# Patient Record
Sex: Female | Born: 1975 | Race: Black or African American | Hispanic: No | Marital: Married | Smoking: Never smoker
Health system: Southern US, Community
[De-identification: ages and names within clinical notes are randomized; demographics above are authoritative.]

## PROBLEM LIST (undated history)

## (undated) HISTORY — PX: WISDOM TOOTH EXTRACTION: SHX21

---

## 2003-03-09 ENCOUNTER — Other Ambulatory Visit: Admission: RE | Admit: 2003-03-09 | Discharge: 2003-03-09 | Payer: Self-pay | Admitting: Family Medicine

## 2004-03-21 ENCOUNTER — Other Ambulatory Visit: Admission: RE | Admit: 2004-03-21 | Discharge: 2004-03-21 | Payer: Self-pay | Admitting: Obstetrics and Gynecology

## 2004-10-20 ENCOUNTER — Inpatient Hospital Stay (HOSPITAL_COMMUNITY): Admission: AD | Admit: 2004-10-20 | Discharge: 2004-10-23 | Payer: Self-pay | Admitting: Obstetrics and Gynecology

## 2004-12-23 ENCOUNTER — Other Ambulatory Visit: Admission: RE | Admit: 2004-12-23 | Discharge: 2004-12-23 | Payer: Self-pay | Admitting: Obstetrics and Gynecology

## 2008-10-22 ENCOUNTER — Ambulatory Visit (HOSPITAL_COMMUNITY): Admission: RE | Admit: 2008-10-22 | Discharge: 2008-10-22 | Payer: Self-pay | Admitting: Obstetrics and Gynecology

## 2008-10-22 IMAGING — US US OB DETAIL+14 WK
1 series · 18 of 28 positions shown · non-contrast
Comparison: none

OBSTETRICAL ULTRASOUND:
 This ultrasound was performed in The [HOSPITAL], and the [facility] report will be stored to [REDACTED] PACS.

[Series 1: us ob detail+14 wk · 18 of 82 slices shown]
[im 1/82]
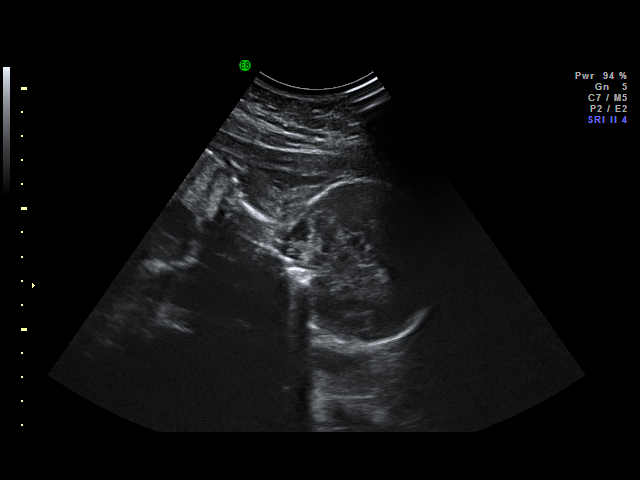
[im 7/82]
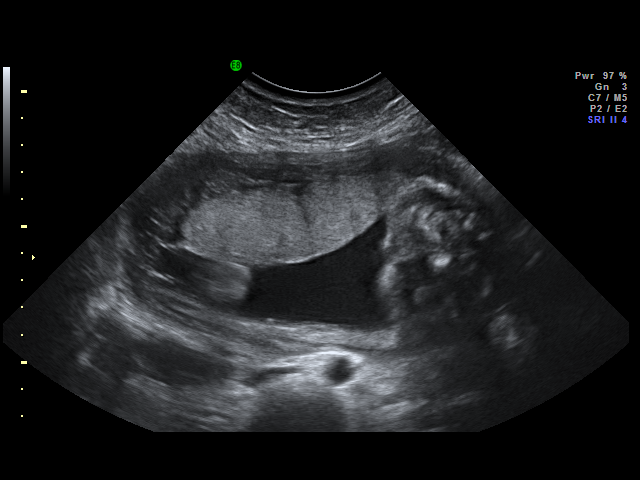
[im 10/82]
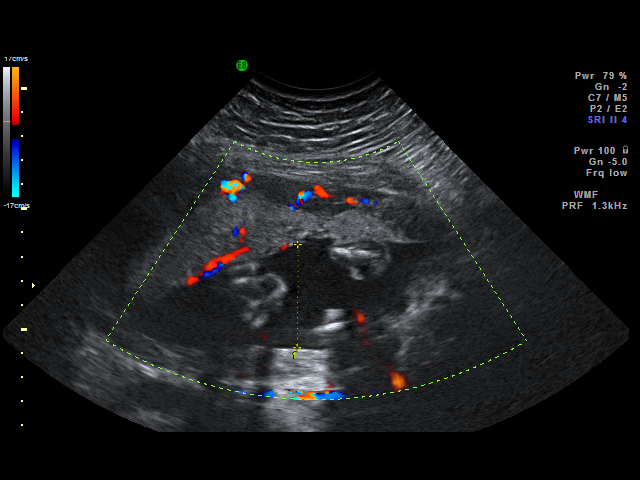
[im 16/82]
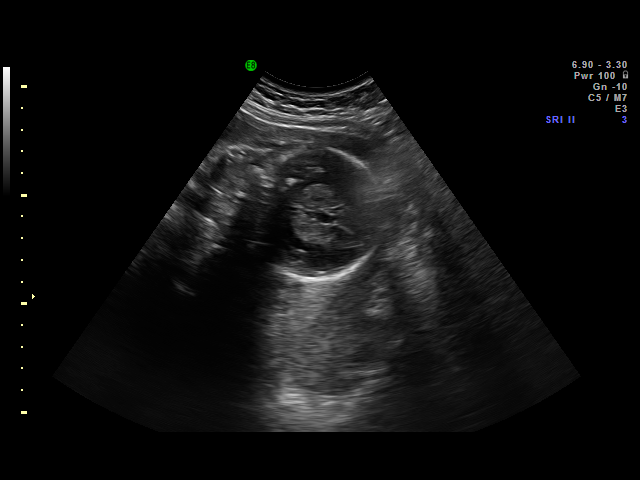
[im 22/82]
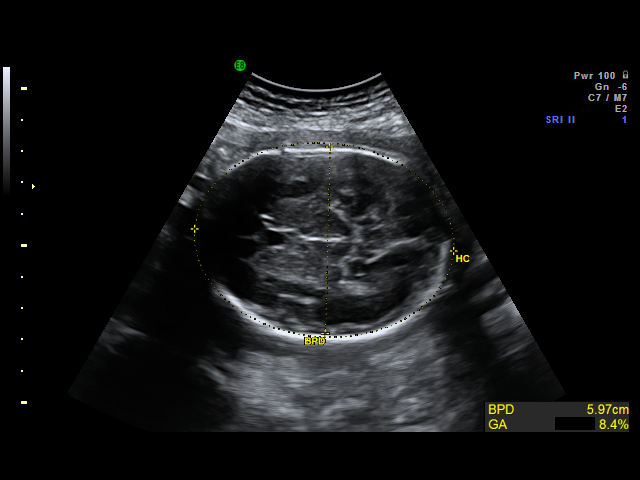
[im 25/82]
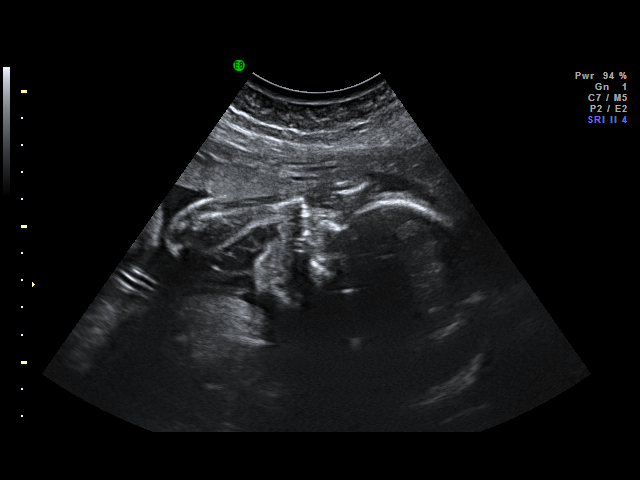
[im 31/82]
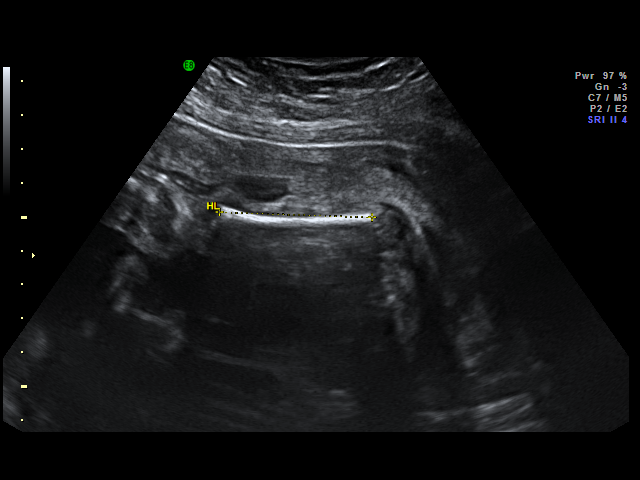
[im 34/82]
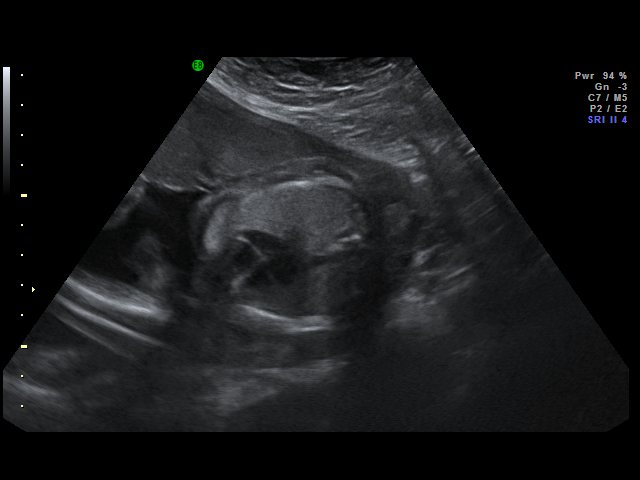
[im 40/82]
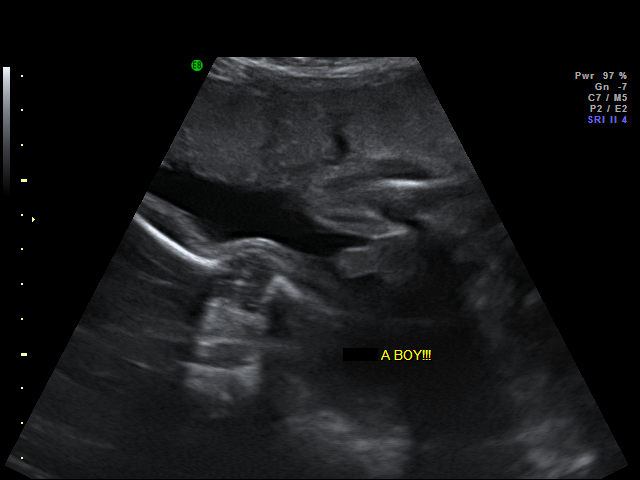
[im 43/82]
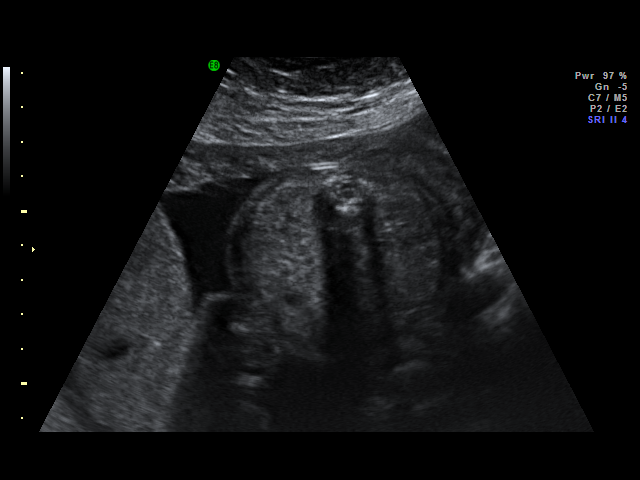
[im 49/82]
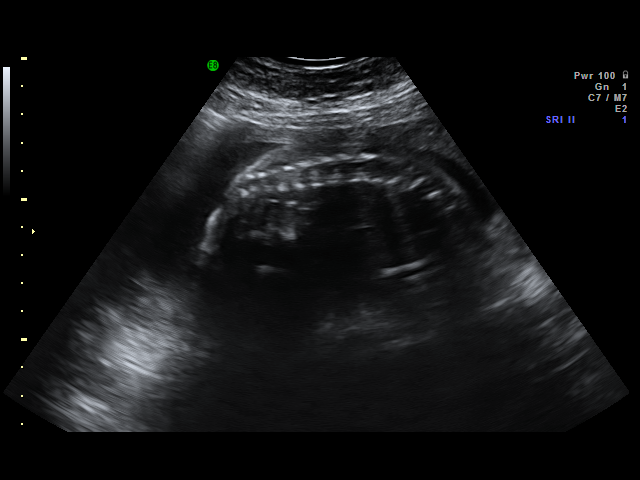
[im 52/82]
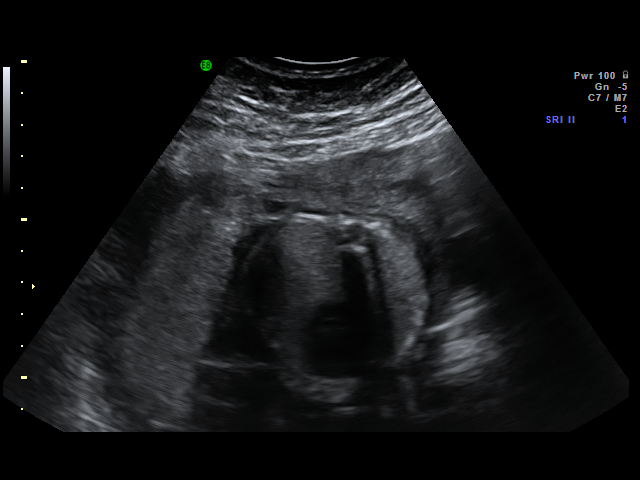
[im 58/82]
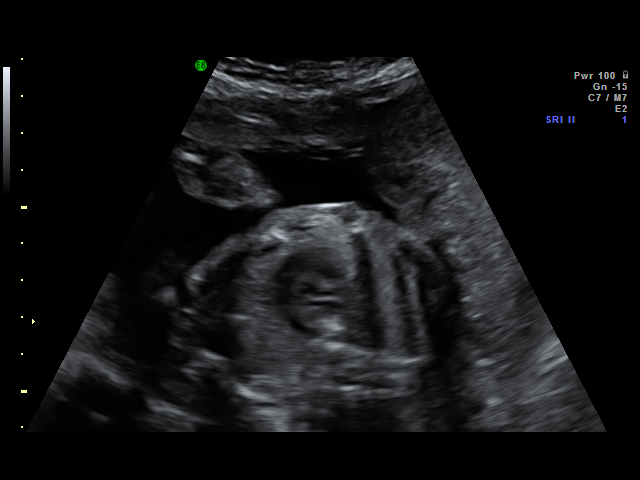
[im 64/82]
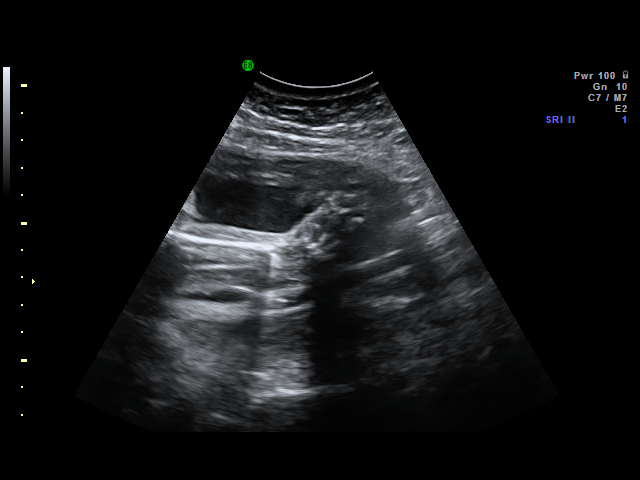
[im 67/82]
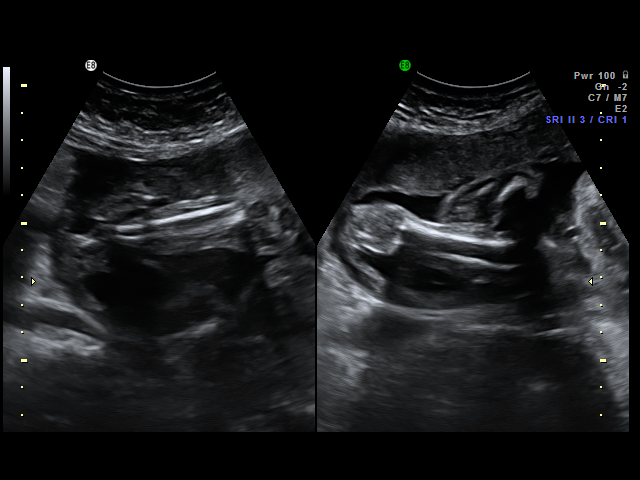
[im 73/82]
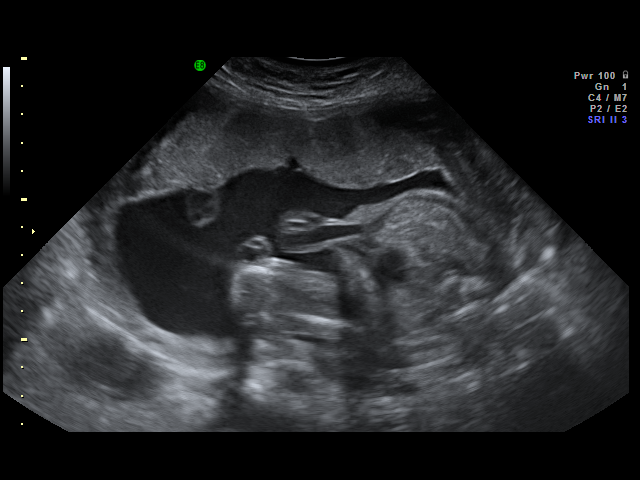
[im 76/82]
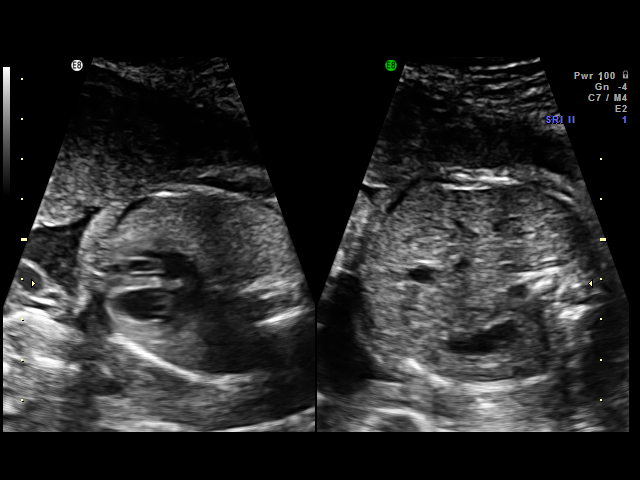
[im 82/82]
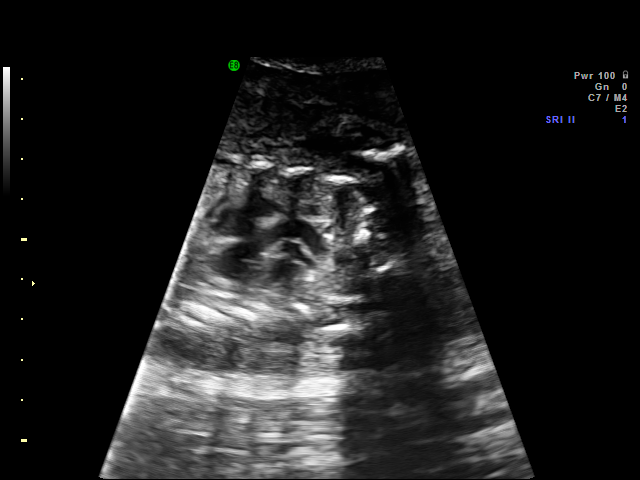

[18 of 28 positions shown; findings below may reference images not displayed]

IMPRESSION: [facility] has also been faxed to the ordering physician.

## 2009-01-25 ENCOUNTER — Inpatient Hospital Stay (HOSPITAL_COMMUNITY): Admission: AD | Admit: 2009-01-25 | Discharge: 2009-01-27 | Payer: Self-pay | Admitting: Obstetrics and Gynecology

## 2010-05-17 LAB — CBC
MCHC: 34.4 g/dL (ref 30.0–36.0)
Platelets: 141 10*3/uL — ABNORMAL LOW (ref 150–400)
Platelets: 143 10*3/uL — ABNORMAL LOW (ref 150–400)
RBC: 3.59 MIL/uL — ABNORMAL LOW (ref 3.87–5.11)
RDW: 14.1 % (ref 11.5–15.5)
RDW: 14.2 % (ref 11.5–15.5)
WBC: 9.9 10*3/uL (ref 4.0–10.5)

## 2010-05-17 LAB — RPR: RPR Ser Ql: NONREACTIVE

## 2015-03-26 ENCOUNTER — Encounter (HOSPITAL_COMMUNITY): Payer: Self-pay | Admitting: Emergency Medicine

## 2015-03-26 DIAGNOSIS — R1013 Epigastric pain: Secondary | ICD-10-CM | POA: Diagnosis present

## 2015-03-26 DIAGNOSIS — Z880 Allergy status to penicillin: Secondary | ICD-10-CM | POA: Diagnosis not present

## 2015-03-26 DIAGNOSIS — R1033 Periumbilical pain: Secondary | ICD-10-CM | POA: Diagnosis not present

## 2015-03-26 NOTE — ED Notes (Signed)
Patient here with complaint of medial abdominal pain. States onset tonight around 1800. Explains that pain was a cramping type pain. Reports resembled labor pain. Denies nausea, vomiting, diarrhea, and urinary symptoms.

## 2015-03-27 ENCOUNTER — Emergency Department (HOSPITAL_COMMUNITY)
Admission: EM | Admit: 2015-03-27 | Discharge: 2015-03-27 | Disposition: A | Discharge status: Home / Self Care | Payer: 59 | Attending: Emergency Medicine | Admitting: Emergency Medicine

## 2015-03-27 DIAGNOSIS — R1033 Periumbilical pain: Secondary | ICD-10-CM

## 2015-03-27 LAB — COMPREHENSIVE METABOLIC PANEL
ALT: 15 U/L (ref 14–54)
ANION GAP: 10 (ref 5–15)
AST: 24 U/L (ref 15–41)
Albumin: 4.5 g/dL (ref 3.5–5.0)
Alkaline Phosphatase: 46 U/L (ref 38–126)
BUN: 6 mg/dL (ref 6–20)
CHLORIDE: 104 mmol/L (ref 101–111)
CO2: 26 mmol/L (ref 22–32)
Calcium: 9.7 mg/dL (ref 8.9–10.3)
Creatinine, Ser: 0.76 mg/dL (ref 0.44–1.00)
Glucose, Bld: 90 mg/dL (ref 65–99)
POTASSIUM: 3.7 mmol/L (ref 3.5–5.1)
Sodium: 140 mmol/L (ref 135–145)
Total Bilirubin: 1.1 mg/dL (ref 0.3–1.2)
Total Protein: 7.4 g/dL (ref 6.5–8.1)

## 2015-03-27 LAB — URINALYSIS, ROUTINE W REFLEX MICROSCOPIC
Bilirubin Urine: NEGATIVE
GLUCOSE, UA: NEGATIVE mg/dL
Hgb urine dipstick: NEGATIVE
Ketones, ur: NEGATIVE mg/dL
LEUKOCYTES UA: NEGATIVE
NITRITE: NEGATIVE
PH: 6 (ref 5.0–8.0)
PROTEIN: NEGATIVE mg/dL
Specific Gravity, Urine: 1.009 (ref 1.005–1.030)

## 2015-03-27 LAB — CBC
HEMATOCRIT: 39 % (ref 36.0–46.0)
Hemoglobin: 12.8 g/dL (ref 12.0–15.0)
MCH: 30.3 pg (ref 26.0–34.0)
MCHC: 32.8 g/dL (ref 30.0–36.0)
MCV: 92.4 fL (ref 78.0–100.0)
Platelets: 236 10*3/uL (ref 150–400)
RBC: 4.22 MIL/uL (ref 3.87–5.11)
RDW: 12.4 % (ref 11.5–15.5)
WBC: 4.8 10*3/uL (ref 4.0–10.5)

## 2015-03-27 LAB — LIPASE, BLOOD: LIPASE: 27 U/L (ref 11–51)

## 2015-03-27 MED ORDER — RANITIDINE HCL 150 MG/10ML PO SYRP
300.0000 mg | ORAL_SOLUTION | Freq: Once | ORAL | Status: AC
  Administered 2015-03-27: 300 mg via ORAL
  Filled 2015-03-27: qty 20

## 2015-03-27 MED ORDER — DICYCLOMINE HCL 10 MG PO CAPS
20.0000 mg | ORAL_CAPSULE | Freq: Once | ORAL | Status: AC
  Administered 2015-03-27: 20 mg via ORAL
  Filled 2015-03-27: qty 2

## 2015-03-27 NOTE — Discharge Instructions (Signed)
Abdominal Pain, Adult Carly Lindsey, your blood work today was all normal. Take Tylenol or ibuprofen as needed for your pain. See a primary care physician within 3 days for close follow-up. If any symptoms worsen come back to emergency department immediately. Thank you. Many things can cause belly (abdominal) pain. Most times, the belly pain is not dangerous. Many cases of belly pain can be watched and treated at home. HOME CARE   Do not take medicines that help you go poop (laxatives) unless told to by your doctor.  Only take medicine as told by your doctor.  Eat or drink as told by your doctor. Your doctor will tell you if you should be on a special diet. GET HELP IF:  You do not know what is causing your belly pain.  You have belly pain while you are sick to your stomach (nauseous) or have runny poop (diarrhea).  You have pain while you pee or poop.  Your belly pain wakes you up at night.  You have belly pain that gets worse or better when you eat.  You have belly pain that gets worse when you eat fatty foods.  You have a fever. GET HELP RIGHT AWAY IF:   The pain does not go away within 2 hours.  You keep throwing up (vomiting).  The pain changes and is only in the right or left part of the belly.  You have bloody or tarry looking poop. MAKE SURE YOU:   Understand these instructions.  Will watch your condition.  Will get help right away if you are not doing well or get worse.   This information is not intended to replace advice given to you by your health care provider. Make sure you discuss any questions you have with your health care provider.   Document Released: 07/19/2007 Document Revised: 02/20/2014 Document Reviewed: 10/09/2012 Elsevier Interactive Patient Education [employer]

## 2015-03-27 NOTE — ED Provider Notes (Signed)
CSN: [id number]     Arrival date & time 03/26/15  2310 History  By signing my name below, I, Carly Lindsey, attest that this documentation has been prepared under the direction and in the presence of Tomasita Crumble, MD. Electronically Signed: Doreatha Lindsey, ED Scribe. 03/27/2015. 3:29 AM.    Chief Complaint  Patient presents with  . Abdominal Pain   The history is provided by the patient. No language interpreter was used.    HPI Comments: Carly Lindsey is a 40 y.o. female who presents to the Emergency Department complaining of moderate, intermittent, improving epigastric abdominal pain onset last night with associated bilateral mid-back pain. Pt describes her pain as twisting, squeezing and cramping. She states her pain began during dinner while at [employer]. Pt had a seafood dish. Husband reports he ate a pasta dish and is asymptomatic. No Hx of similar symptoms. No h/o abdominal surgery. She denies emesis, diarrhea, dysuria, fever, chills, CP, burning pain, nausea.   History reviewed. No pertinent past medical history. History reviewed. No pertinent past surgical history. No family history on file. Social History  Substance Use Topics  . Smoking status: Never Smoker   . Smokeless tobacco: None  . Alcohol Use: Yes     Comment: Occasional   OB History    No data available     Review of Systems A complete 10 system review of systems was obtained and all systems are negative except as noted in the HPI and PMH.    Allergies  Penicillins  Home Medications   Prior to Admission medications   Medication Sig Start Date End Date Taking? Authorizing Provider  loratadine (CLARITIN) 10 MG tablet Take 10 mg by mouth daily as needed for allergies.   Yes Historical Provider, MD   BP 117/84 mmHg  Pulse 73  Temp(Src) 98.1 F (36.7 C) (Oral)  Resp 19  SpO2 100%  LMP 03/24/2015 (Exact Date) Physical Exam  Constitutional: She is oriented to person, place, and time. She appears well-developed  and well-nourished. No distress.  HENT:  Head: Normocephalic and atraumatic.  Nose: Nose normal.  Mouth/Throat: Oropharynx is clear and moist. No oropharyngeal exudate.  Eyes: Conjunctivae and EOM are normal. Pupils are equal, round, and reactive to light. No scleral icterus.  Neck: Normal range of motion. Neck supple. No JVD present. No tracheal deviation present. No thyromegaly present.  Cardiovascular: Normal rate, regular rhythm and normal heart sounds.  Exam reveals no gallop and no friction rub.   No murmur heard. Pulmonary/Chest: Effort normal and breath sounds normal. No respiratory distress. She has no wheezes. She exhibits no tenderness.  Abdominal: Soft. Bowel sounds are normal. She exhibits no distension and no mass. There is no tenderness. There is no rebound and no guarding.  Musculoskeletal: Normal range of motion. She exhibits no edema or tenderness.  Lymphadenopathy:    She has no cervical adenopathy.  Neurological: She is alert and oriented to person, place, and time. No cranial nerve deficit. She exhibits normal muscle tone.  Skin: Skin is warm and dry. No rash noted. No erythema. No pallor.  Nursing note and vitals reviewed.   ED Course  Procedures (including critical care time) DIAGNOSTIC STUDIES: Oxygen Saturation is 100% on RA, normal by my interpretation.    COORDINATION OF CARE: 3:27 AM Discussed treatment plan with pt at bedside and pt agreed to plan.   Labs Review Labs Reviewed  LIPASE, BLOOD  COMPREHENSIVE METABOLIC PANEL  CBC  URINALYSIS, ROUTINE W REFLEX MICROSCOPIC (NOT  AT [facility])    I have personally reviewed and evaluated these lab results as part of my medical decision-making.   MDM   Final diagnoses:  None    Patient presents emergency department for sudden onset periumbilical abdominal pain which radiates upwards. This occurred while eating at a restaurant. She has no nausea vomiting or diarrhea. Laboratory studies are all completely  normal. Physical exam is unremarkable. I do not believe patient needs advanced imaging at this time. She was given ranitidine and Bentyl for her symptoms. Patient is advised to follow primary care physician within 3 days for close follow-up. She appears well in no acute distress, her vital signs were within her normal limits and she is safe for discharge.  I personally performed the services described in this documentation, which was scribed in my presence. The recorded information has been reviewed and is accurate.     Tomasita Crumble, MD 03/27/15 [phone]

## 2016-01-13 ENCOUNTER — Other Ambulatory Visit: Payer: Self-pay | Admitting: Obstetrics and Gynecology

## 2016-01-13 DIAGNOSIS — R928 Other abnormal and inconclusive findings on diagnostic imaging of breast: Secondary | ICD-10-CM

## 2016-01-17 ENCOUNTER — Ambulatory Visit
Admission: RE | Admit: 2016-01-17 | Discharge: 2016-01-17 | Disposition: A | Discharge status: Home / Self Care | Payer: 59 | Source: Ambulatory Visit | Attending: Obstetrics and Gynecology | Admitting: Obstetrics and Gynecology

## 2016-01-17 DIAGNOSIS — R928 Other abnormal and inconclusive findings on diagnostic imaging of breast: Secondary | ICD-10-CM

## 2016-01-17 IMAGING — MG 2D DIGITAL DIAGNOSTIC UNILATERAL RIGHT MAMMOGRAM WITH CAD AND AD
6 series · 6 of 14 positions shown · non-contrast
Comparison: [DATE], [DATE]

CLINICAL DATA: Patient returns after screening study for evaluation
of possible right breast asymmetry.

EXAM:
2D DIGITAL DIAGNOSTIC RIGHT MAMMOGRAM WITH CAD AND ADJUNCT TOMO
ULTRASOUND RIGHT BREAST

[R ML]
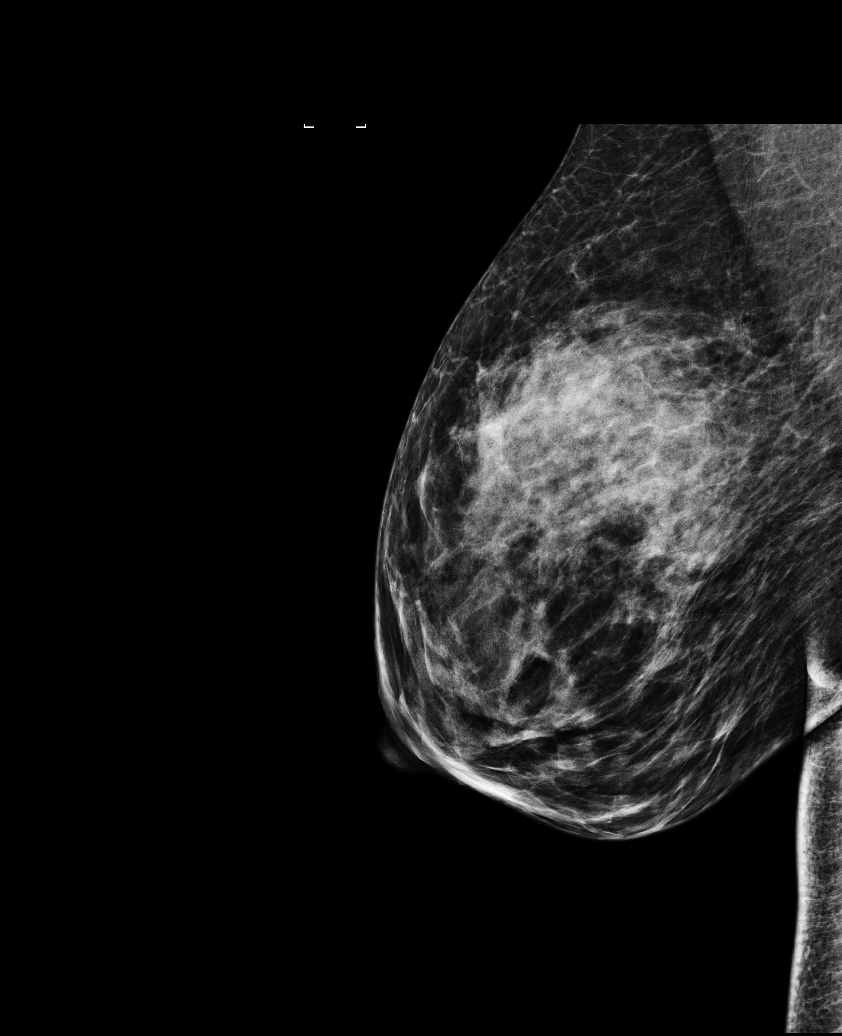

[R ML synth-2D]
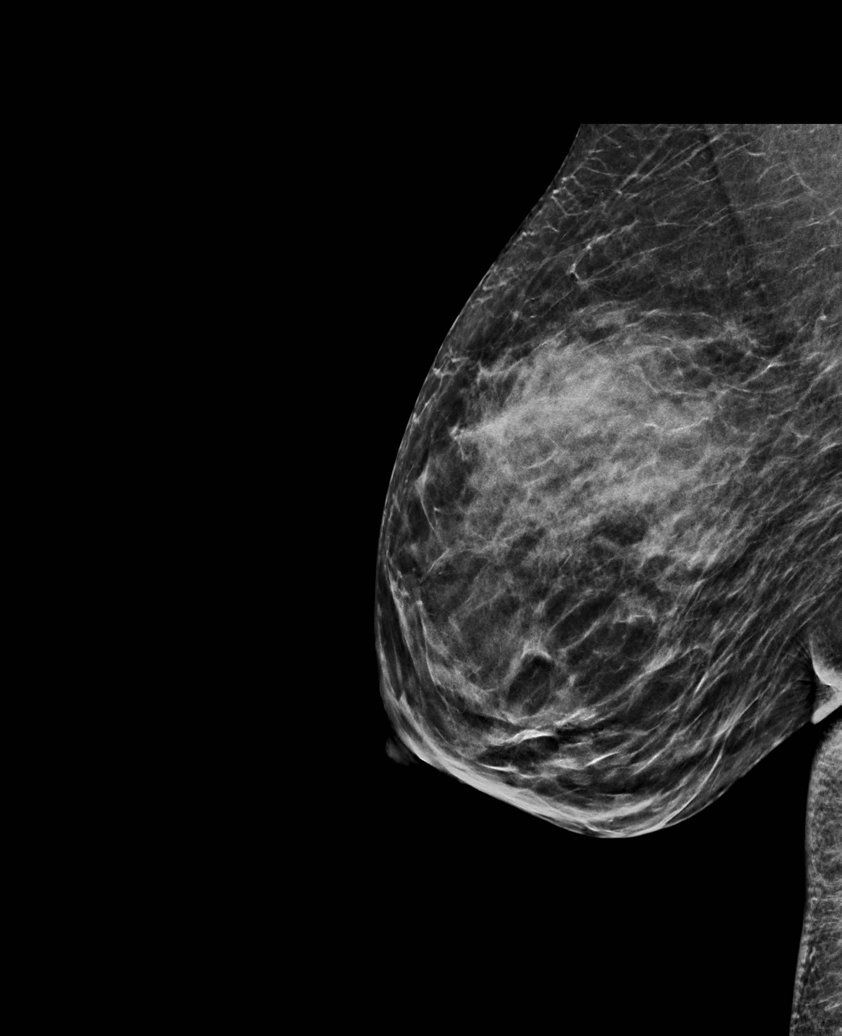

[R MLO]
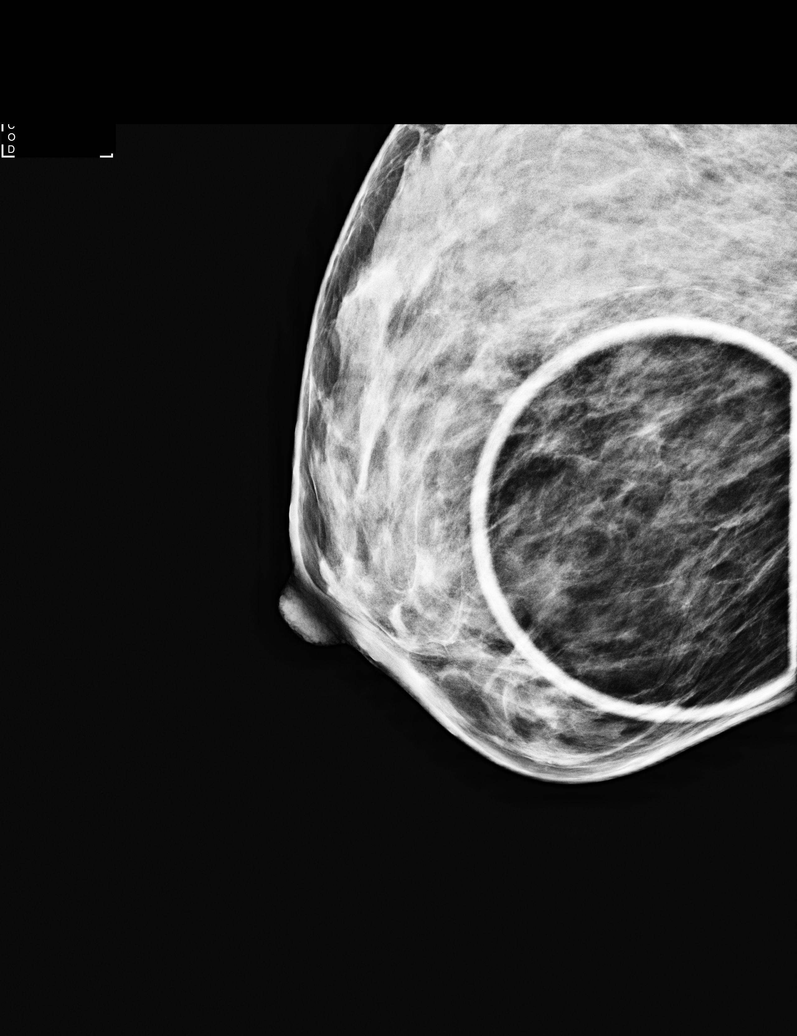

[R MLO synth-2D]
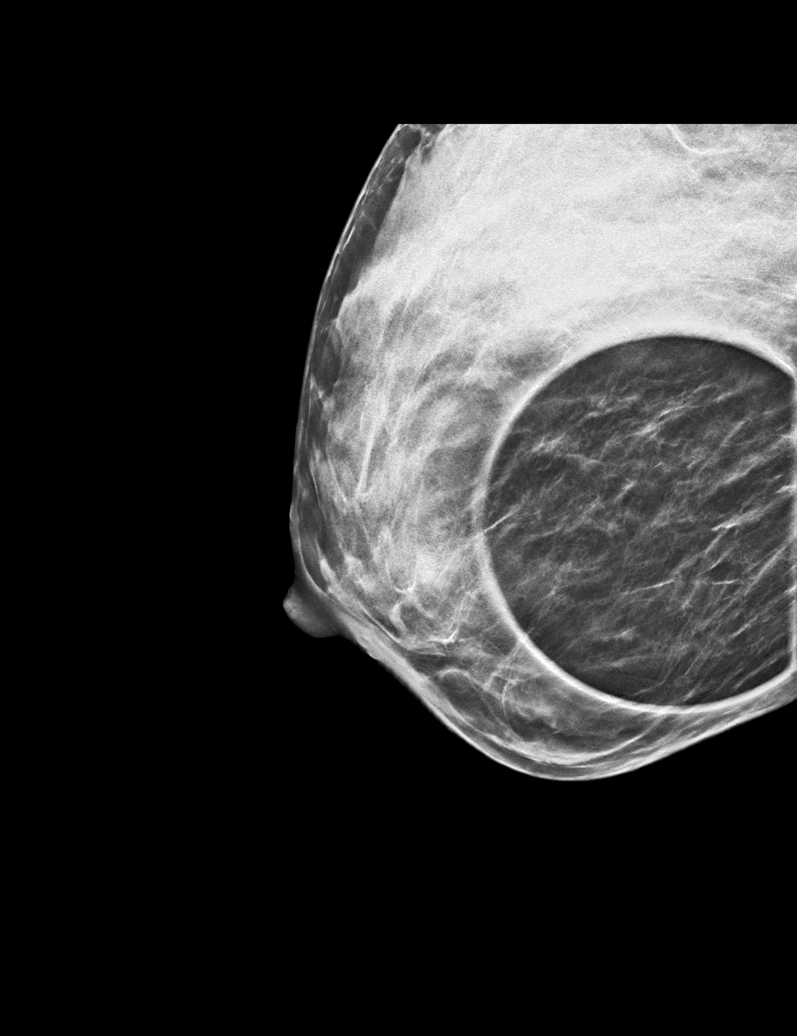

[R MLO tomo · tomo slice 20/39.0]
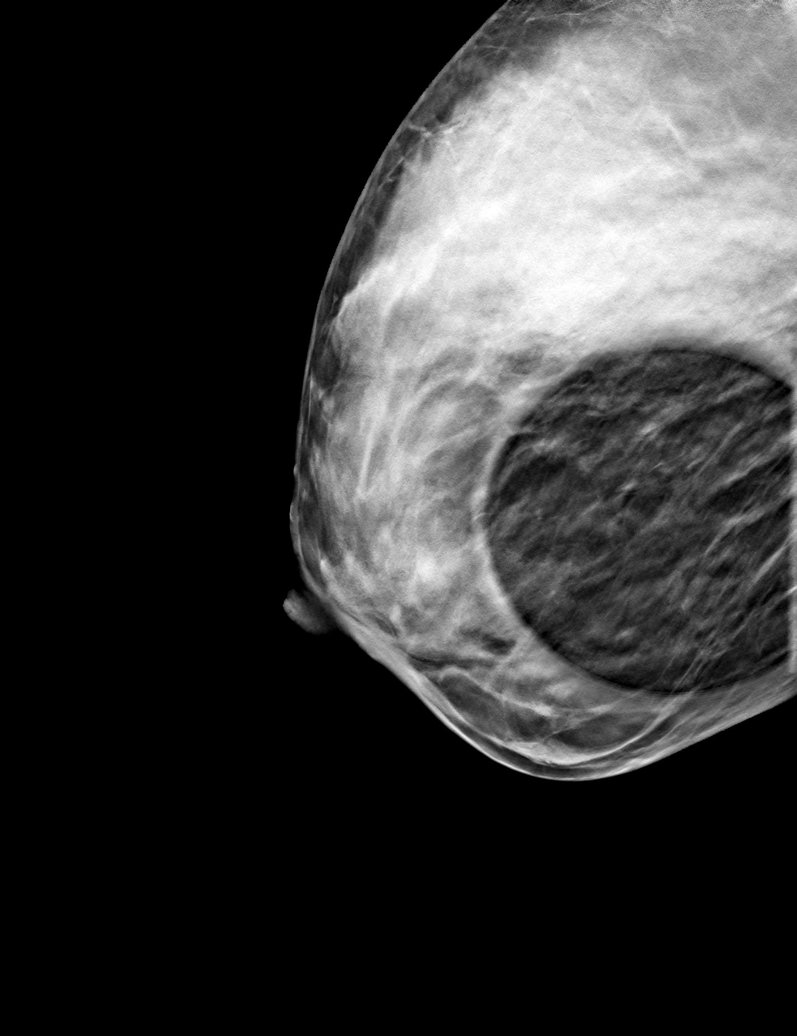

[R ML tomo · tomo slice 29/56.0]
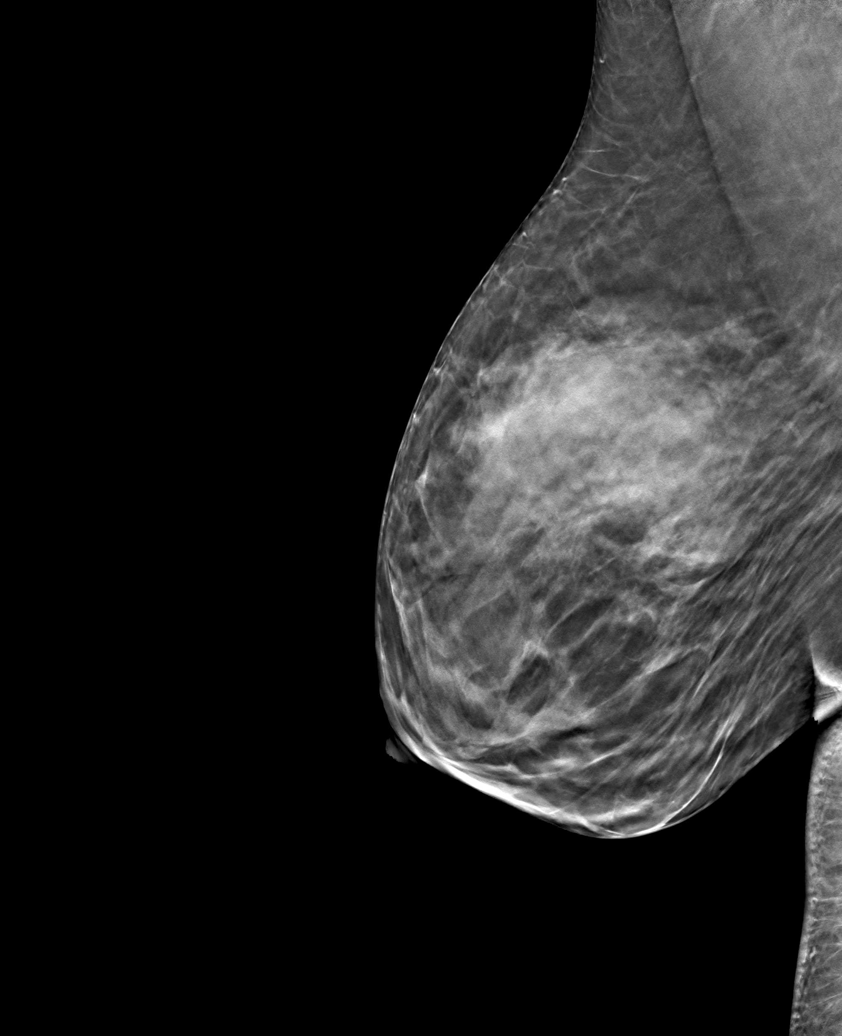

[6 of 14 positions shown; findings below may reference images not displayed]

ACR Breast Density Category c: The breast tissue is heterogeneously
dense, which may obscure small masses.
FINDINGS: Additional views are performed, demonstrating no persistent
abnormality in the central aspect of the right breast.

Mammographic images were processed with CAD.

On physical exam, I palpate no abnormality in the central aspect of
the right breast.

Targeted ultrasound is performed, showing normal appearing
fibroglandular tissue throughout the central aspect of the right
breast. No mass, distortion, or acoustic shadowing is demonstrated
with ultrasound.
IMPRESSION: No ultrasound evidence for malignancy.

RECOMMENDATION:
Screening mammogram in one year.(Code:[MT])

I have discussed the findings and recommendations with the patient.
Results were also provided in writing at the conclusion of the
visit. If applicable, a reminder letter will be sent to the patient
regarding the next appointment.

BI-RADS CATEGORY  1: Negative.

## 2020-11-30 ENCOUNTER — Other Ambulatory Visit: Payer: Self-pay | Admitting: Chiropractic Medicine

## 2020-11-30 DIAGNOSIS — M25511 Pain in right shoulder: Secondary | ICD-10-CM

## 2020-12-26 ENCOUNTER — Other Ambulatory Visit: Payer: Self-pay

## 2020-12-26 ENCOUNTER — Ambulatory Visit
Admission: RE | Admit: 2020-12-26 | Discharge: 2020-12-26 | Disposition: A | Discharge status: Home / Self Care | Payer: 59 | Source: Ambulatory Visit | Attending: Chiropractic Medicine | Admitting: Chiropractic Medicine

## 2020-12-26 DIAGNOSIS — M25511 Pain in right shoulder: Secondary | ICD-10-CM

## 2020-12-26 IMAGING — MR MR SHOULDER*R* W/O CM
4 of 5 series · 21 of 40 positions shown · non-contrast
Comparison: None.

CLINICAL DATA: Right shoulder pain

EXAM:
MRI OF THE RIGHT SHOULDER WITHOUT CONTRAST
TECHNIQUE: Multiplanar, multisequence MR imaging of the shoulder was performed.
No intravenous contrast was administered.

[Series 6: T2 fat-sat · axial · right · 3.0mm · 0.47mm/px · z∈[-43,+58]mm · 8 of 28 slices shown (1 of 3)]
[im 1/28]
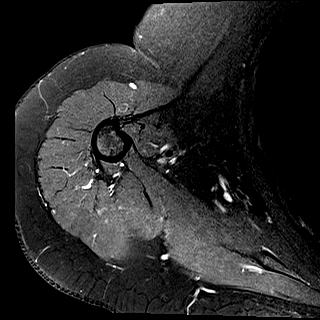
[im 4/28]
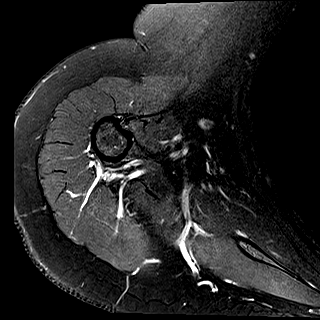
[im 10/28]
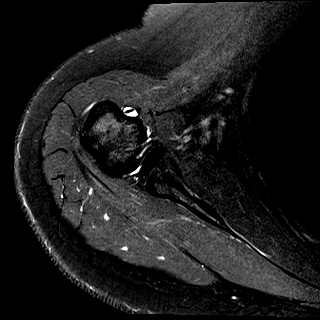
[im 13/28]
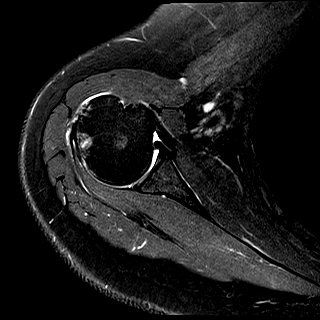
[im 16/28]
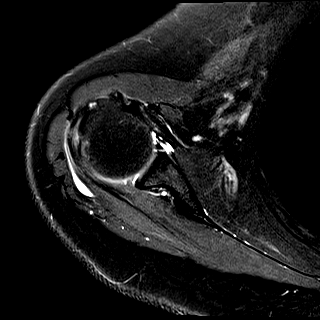
[im 19/28]
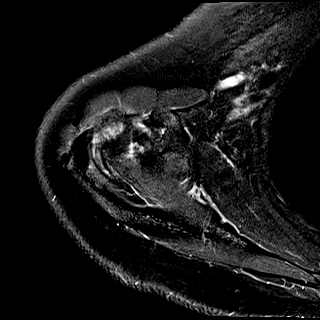
[im 25/28]
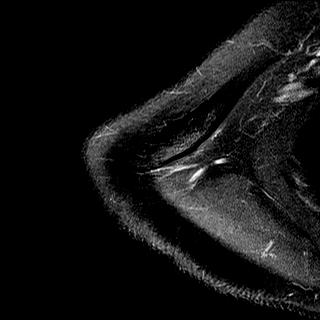
[im 28/28]
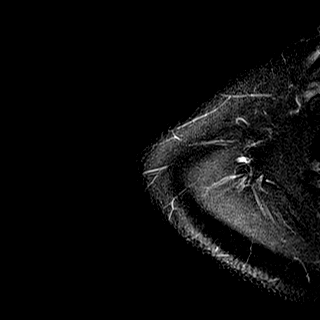

[Series 7: T2 fat-sat · oblique · right · 4.0mm · 0.22mm/px · 3 of 21 slices shown (2 of 3)]
[im 4/21]
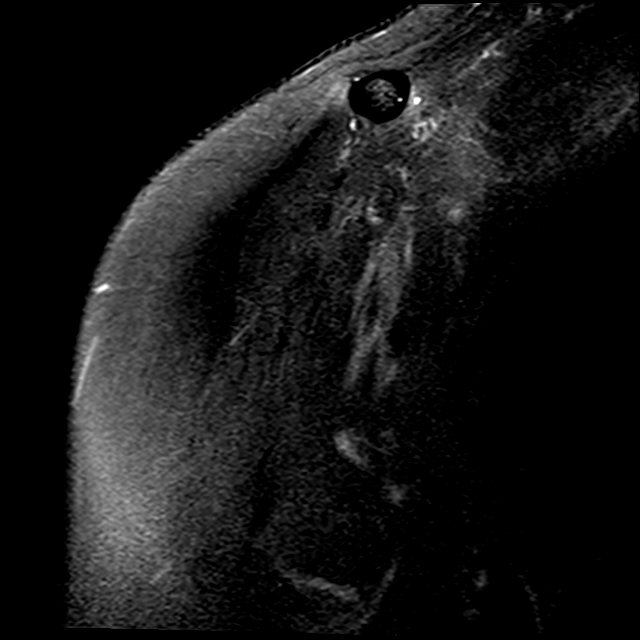
[im 11/21]
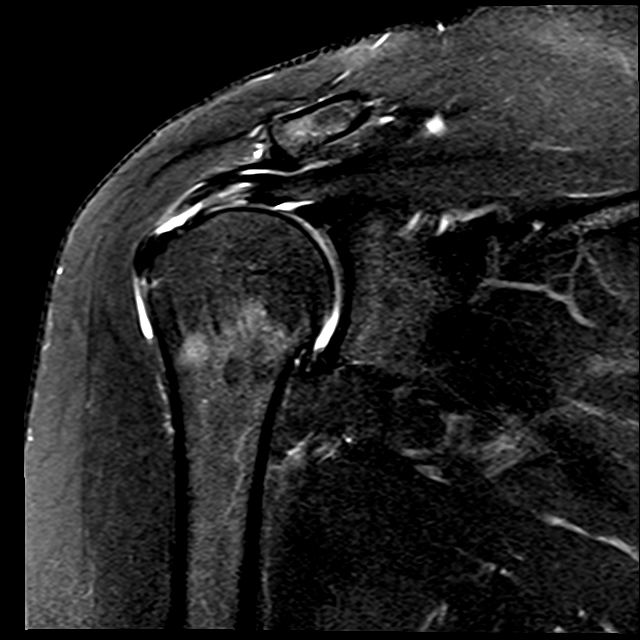
[im 17/21]
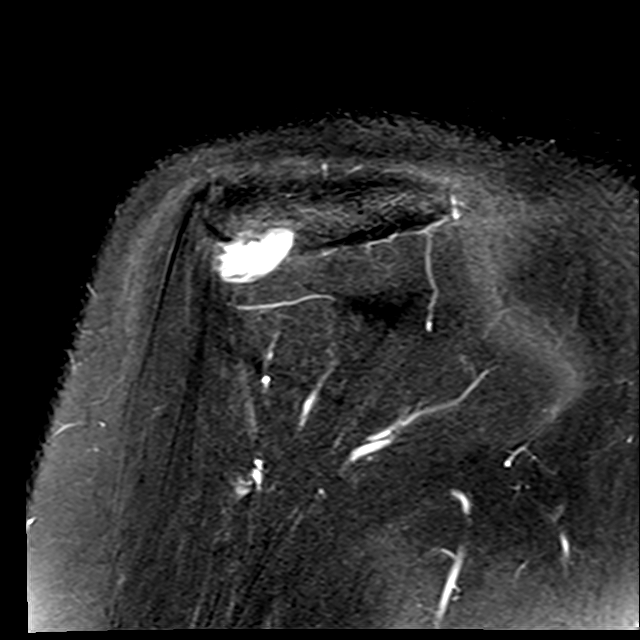

[Series 8: PD · oblique · right · 4.0mm · 0.22mm/px · 7 of 21 slices shown]
[im 1/21]
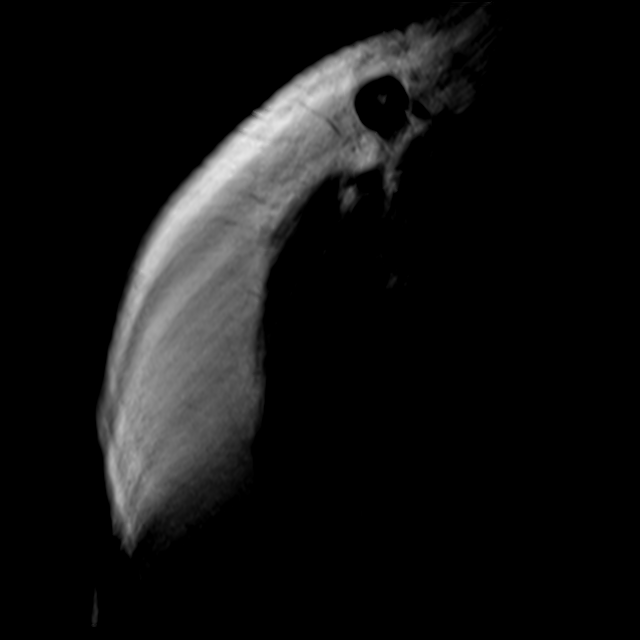
[im 4/21]
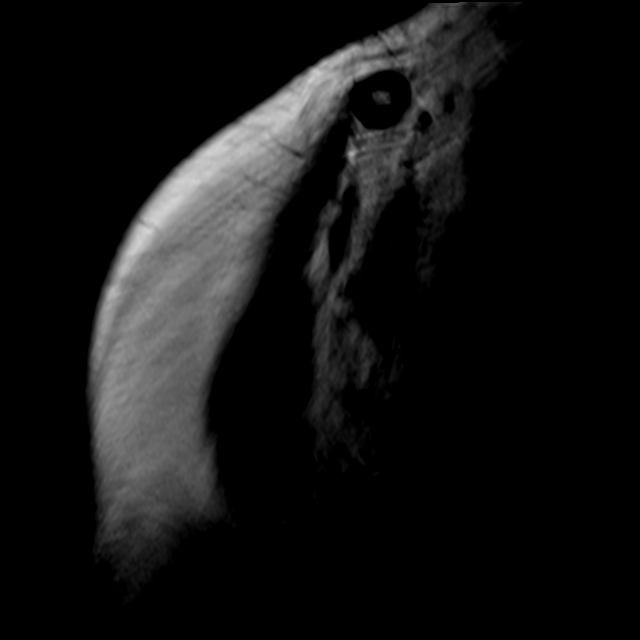
[im 7/21]
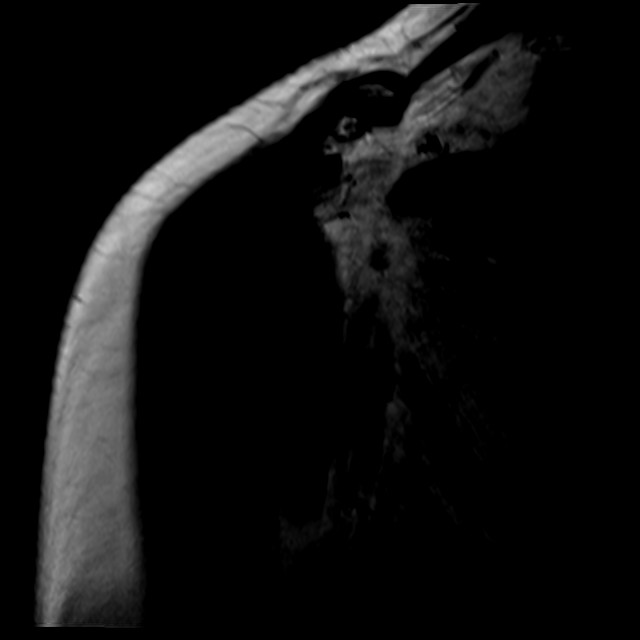
[im 11/21]
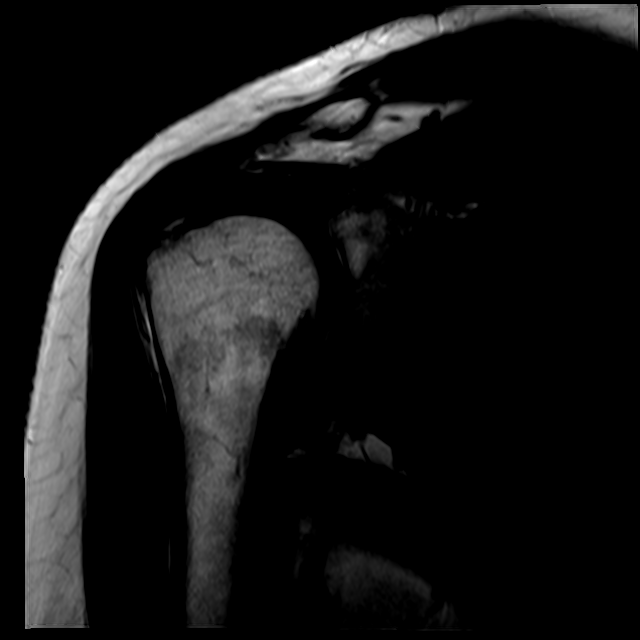
[im 14/21]
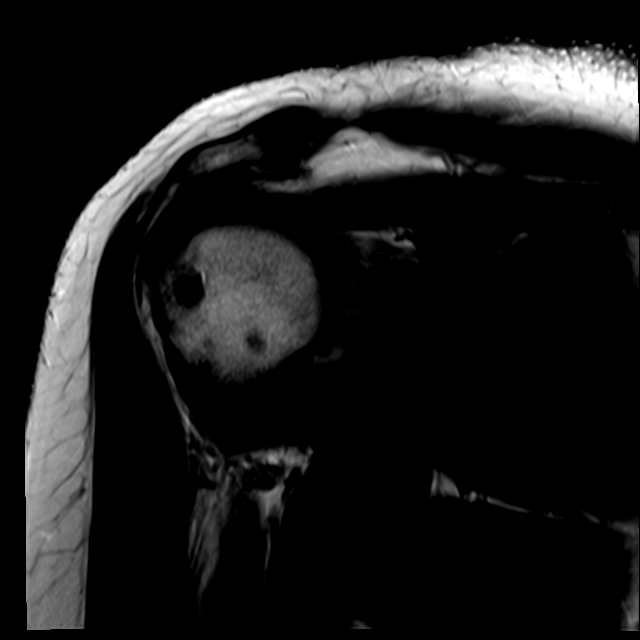
[im 17/21]
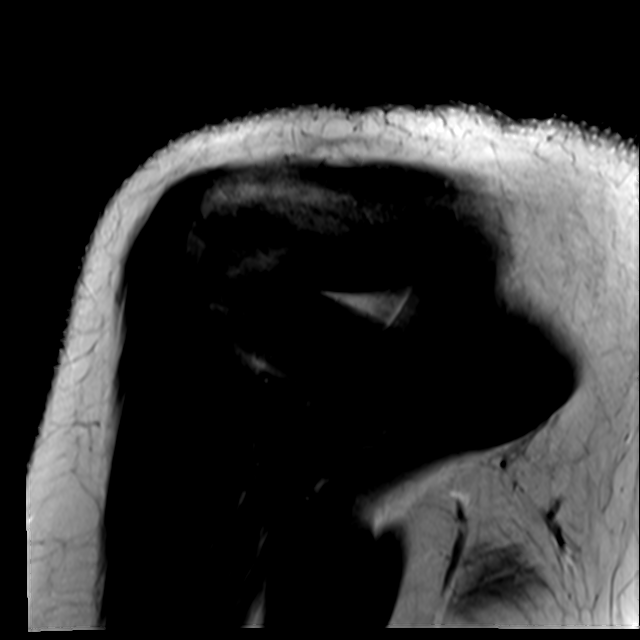
[im 21/21]
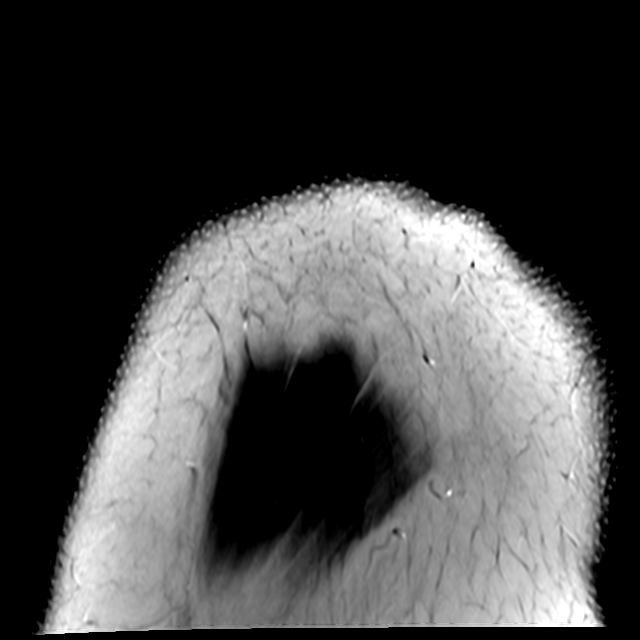

[Series 9: T2 fat-sat · oblique · right · 4.0mm · 0.44mm/px · 3 of 23 slices shown (3 of 3)]
[im 4/23]
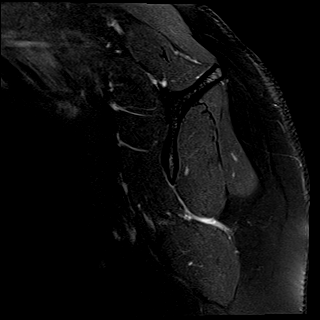
[im 13/23]
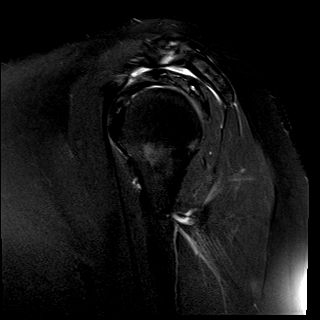
[im 19/23]
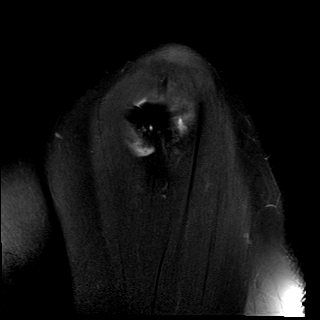

[21 of 40 positions shown; findings below may reference images not displayed]

FINDINGS: Rotator cuff: There is a full-thickness, full width tear of the
supraspinatus tendon with up to 2.4 cm retraction. There is
tendinosis and low-grade articular sided tearing of the anterior
infraspinatus tendon at the footprint. Teres minor tendon is intact.
Subscapularis tendon is intact.

Muscles: No muscle atrophy or edema. No intramuscular fluid
collection or hematoma.

Biceps Long Head: Intraarticular and extraarticular portions of the
biceps tendon are intact.

Acromioclavicular Joint: Minimal arthropathy of the
acromioclavicular joint. Small amount of subacromial/subdeltoid
bursal fluid related to the cuff tear.

Glenohumeral Joint: Trace joint effusion.  No chondral defect.

Labrum: Grossly intact, but evaluation is limited by lack of
intraarticular fluid/contrast.

Bones: No fracture or dislocation. No aggressive osseous lesion.

Other: No fluid collection or hematoma.
IMPRESSION: Full-thickness, full width tear of the supraspinatus tendon with up
to 2.4 cm retraction. Tendinosis and low-grade articular sided
tearing of the anterior infraspinatus tendon at the footprint. No
significant muscle atrophy.

## 2021-01-25 ENCOUNTER — Other Ambulatory Visit: Payer: Self-pay | Admitting: Chiropractic Medicine

## 2021-01-25 DIAGNOSIS — M25511 Pain in right shoulder: Secondary | ICD-10-CM

## 2021-01-25 DIAGNOSIS — M25512 Pain in left shoulder: Secondary | ICD-10-CM

## 2021-02-15 ENCOUNTER — Ambulatory Visit
Admission: RE | Admit: 2021-02-15 | Discharge: 2021-02-15 | Disposition: A | Discharge status: Home / Self Care | Payer: Self-pay | Source: Ambulatory Visit | Attending: Chiropractic Medicine | Admitting: Chiropractic Medicine

## 2021-02-15 ENCOUNTER — Other Ambulatory Visit: Payer: Self-pay

## 2021-02-15 DIAGNOSIS — M25511 Pain in right shoulder: Secondary | ICD-10-CM

## 2021-02-15 DIAGNOSIS — M25512 Pain in left shoulder: Secondary | ICD-10-CM
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
## 2021-02-28 ENCOUNTER — Ambulatory Visit (INDEPENDENT_AMBULATORY_CARE_PROVIDER_SITE_OTHER): Payer: 59 | Admitting: Orthopedic Surgery

## 2021-02-28 ENCOUNTER — Other Ambulatory Visit: Payer: Self-pay

## 2021-02-28 DIAGNOSIS — S46011A Strain of muscle(s) and tendon(s) of the rotator cuff of right shoulder, initial encounter: Secondary | ICD-10-CM

## 2021-03-02 ENCOUNTER — Encounter: Payer: Self-pay | Admitting: Orthopedic Surgery
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
## 2021-03-07 ENCOUNTER — Ambulatory Visit: Payer: 59 | Admitting: Orthopedic Surgery

## 2021-03-07 ENCOUNTER — Other Ambulatory Visit: Payer: Self-pay

## 2021-03-14 NOTE — Progress Notes (Signed)
Surgical Instructions    Your procedure is scheduled on Thursday, February 2nd, 2023.   Report to [facility] Main Entrance "A" at 10:15 A.M., then check in with the Admitting office.  Call this number if you have problems the morning of surgery:  [phone]   If you have any questions prior to your surgery date call [phone]: Open Monday-Friday 8am-4pm    Remember:  Do not eat after midnight the night before your surgery  You may drink clear liquids until 09:15 the morning of your surgery.   Clear liquids allowed are: Water, Non-Citrus Juices (without pulp), Carbonated Beverages, Clear Tea, Black Coffee ONLY (NO MILK, CREAM OR POWDERED CREAMER of any kind), and Gatorade  Patient Instructions  The night before surgery:  No food after midnight. ONLY clear liquids after midnight  The day of surgery (if you do NOT have diabetes):  Drink ONE (1) Pre-Surgery Clear Ensure by 09:15 the morning of surgery. Drink in one sitting. Do not sip.  This drink was given to you during your hospital  pre-op appointment visit.  Nothing else to drink after completing the  Pre-Surgery Clear Ensure.          If you have questions, please contact your surgeons office.     Take these medicines the morning of surgery with A SIP OF WATER:  loratadine (CLARITIN) - as needed  pseudoephedrine (SUDAFED) - as needed baclofen (LIORESAL) - as needed  As of today, STOP taking any Aspirin (unless otherwise instructed by your surgeon) Aleve, Naproxen, Ibuprofen, Motrin, Advil, Goody's, BC's, all herbal medications, fish oil, and all vitamins.   The day of surgery:          Do not wear jewelry or makeup Do not wear lotions, powders, perfumes, or deodorant. Do not shave 48 hours prior to surgery.  . Do not bring valuables to the hospital. Do not wear nail polish, gel polish, artificial nails, or any other type of covering on natural nails (fingers and toes) If you have artificial nails or gel  coating that need to be removed by a nail salon, please have this removed prior to surgery. Artificial nails or gel coating may interfere with anesthesia's ability to adequately monitor your vital signs.              [facility] is not responsible for any belongings or valuables.  Do NOT Smoke (Tobacco/Vaping)  24 hours prior to your procedure  If you use a CPAP at night, you may bring your mask for your overnight stay.   Contacts, glasses, hearing aids, dentures or partials may not be worn into surgery, please bring cases for these belongings   For patients admitted to the hospital, discharge time will be determined by your treatment team.   Patients discharged the day of surgery will not be allowed to drive home, and someone needs to stay with them for 24 hours.  NO VISITORS WILL BE ALLOWED IN PRE-OP WHERE PATIENTS ARE PREPPED FOR SURGERY.  ONLY 1 SUPPORT PERSON MAY BE PRESENT IN THE WAITING ROOM WHILE YOU ARE IN SURGERY.  IF YOU ARE TO BE ADMITTED, ONCE YOU ARE IN YOUR ROOM YOU WILL BE ALLOWED TWO (2) VISITORS. 1 (ONE) VISITOR MAY STAY OVERNIGHT BUT MUST ARRIVE TO THE ROOM BY 8pm.  Minor children may have two parents present. Special consideration for safety and communication needs will be reviewed on a case by case basis.  Special instructions:    Oral Hygiene is also important to  reduce your risk of infection.  Remember - BRUSH YOUR TEETH THE MORNING OF SURGERY WITH YOUR REGULAR TOOTHPASTE   Carly Lindsey- Preparing For Surgery  Before surgery, you can play an important role. Because skin is not sterile, your skin needs to be as free of germs as possible. You can reduce the number of germs on your skin by washing with CHG (chlorahexidine gluconate) Soap before surgery.  CHG is an antiseptic cleaner which kills germs and bonds with the skin to continue killing germs even after washing.     Please do not use if you have an allergy to CHG or antibacterial soaps. If your skin becomes  reddened/irritated stop using the CHG.  Do not shave (including legs and underarms) for at least 48 hours prior to first CHG shower. It is OK to shave your face.  Please follow these instructions carefully.     Shower the NIGHT BEFORE SURGERY and the MORNING OF SURGERY with CHG Soap.   If you chose to wash your hair, wash your hair first as usual with your normal shampoo. After you shampoo, rinse your hair and body thoroughly to remove the shampoo.  Then [employer] and genitals (private parts) with your normal soap and rinse thoroughly to remove soap.  After that Use CHG Soap as you would any other liquid soap. You can apply CHG directly to the skin and wash gently with a scrungie or a clean washcloth.   Apply the CHG Soap to your body ONLY FROM THE NECK DOWN.  Do not use on open wounds or open sores. Avoid contact with your eyes, ears, mouth and genitals (private parts). Wash Face and genitals (private parts)  with your normal soap.   Wash thoroughly, paying special attention to the area where your surgery will be performed.  Thoroughly rinse your body with warm water from the neck down.  DO NOT shower/wash with your normal soap after using and rinsing off the CHG Soap.  Pat yourself dry with a CLEAN TOWEL.  Wear CLEAN PAJAMAS to bed the night before surgery  Place CLEAN SHEETS on your bed the night before your surgery  DO NOT SLEEP WITH PETS.   Day of Surgery:  Take a shower with CHG soap. Wear Clean/Comfortable clothing the morning of surgery Do not apply any deodorants/lotions.   Remember to brush your teeth WITH YOUR REGULAR TOOTHPASTE.   Please read over the following fact sheets that you were given.

## 2021-03-15 ENCOUNTER — Other Ambulatory Visit: Payer: Self-pay

## 2021-03-15 ENCOUNTER — Encounter (HOSPITAL_COMMUNITY)
Admission: RE | Admit: 2021-03-15 | Discharge: 2021-03-15 | Disposition: A | Discharge status: Home / Self Care | Payer: 59 | Source: Ambulatory Visit | Attending: Orthopedic Surgery | Admitting: Orthopedic Surgery

## 2021-03-15 ENCOUNTER — Encounter (HOSPITAL_COMMUNITY): Payer: Self-pay

## 2021-03-15 VITALS — BP 112/78 | HR 78 | Temp 98.4°F | Resp 17 | Ht 65.0 in | Wt 156.0 lb

## 2021-03-15 DIAGNOSIS — Z01812 Encounter for preprocedural laboratory examination: Secondary | ICD-10-CM | POA: Insufficient documentation

## 2021-03-15 DIAGNOSIS — Z01818 Encounter for other preprocedural examination: Secondary | ICD-10-CM

## 2021-03-15 LAB — SURGICAL PCR SCREEN
MRSA, PCR: NEGATIVE
Staphylococcus aureus: NEGATIVE

## 2021-03-15 LAB — CBC
HCT: 38.7 % (ref 36.0–46.0)
Hemoglobin: 12.7 g/dL (ref 12.0–15.0)
MCH: 30.9 pg (ref 26.0–34.0)
MCHC: 32.8 g/dL (ref 30.0–36.0)
MCV: 94.2 fL (ref 80.0–100.0)
Platelets: 210 10*3/uL (ref 150–400)
RBC: 4.11 MIL/uL (ref 3.87–5.11)
RDW: 12.1 % (ref 11.5–15.5)
WBC: 3.8 10*3/uL — ABNORMAL LOW (ref 4.0–10.5)
nRBC: 0 % (ref 0.0–0.2)

## 2021-03-15 LAB — BASIC METABOLIC PANEL
Anion gap: 7 (ref 5–15)
BUN: 8 mg/dL (ref 6–20)
CO2: 24 mmol/L (ref 22–32)
Calcium: 9.2 mg/dL (ref 8.9–10.3)
Chloride: 109 mmol/L (ref 98–111)
Creatinine, Ser: 0.74 mg/dL (ref 0.44–1.00)
GFR, Estimated: >60 mL/min (ref >60)
Glucose, Bld: 93 mg/dL (ref 70–99)
Potassium: 3.8 mmol/L (ref 3.5–5.1)
Sodium: 140 mmol/L (ref 135–145)

## 2021-03-15 NOTE — Progress Notes (Addendum)
PCP - Kathe Becton, PA-C Cardiologist - denies  PPM/ICD - denies Device Orders - n/a Rep Notified - n/a  Chest x-ray - n/a EKG - 03/11/2020 - (EKG normal) - records requested Stress Test - denies ECHO - denies Cardiac Cath - denies  Sleep Study - denies CPAP - n/a  Fasting Blood Sugar - n/a  Blood Thinner Instructions: n/a  Aspirin Instructions: Patient was instructed: As of today, STOP taking any Aspirin (unless otherwise instructed by your surgeon) Aleve, Naproxen, Ibuprofen, Motrin, Advil, Goody's, BC's, all herbal medications, fish oil, and all vitamins.  ERAS Protcol - yes PRE-SURGERY Ensure - yes, until 09:15 o'clock  COVID TEST- no - ambulatory surgery   Anesthesia review: no  Patient denies shortness of breath, fever, cough and chest pain at PAT appointment   All instructions explained to the patient, with a verbal understanding of the material. Patient agrees to go over the instructions while at home for a better understanding. Patient also instructed to self quarantine after being tested for COVID-19. The opportunity to ask questions was provided.

## 2021-03-17 ENCOUNTER — Encounter (HOSPITAL_COMMUNITY)
Admission: RE | Disposition: A | Discharge status: Home / Self Care | Payer: Self-pay | Source: Home / Self Care | Attending: Orthopedic Surgery

## 2021-03-17 ENCOUNTER — Ambulatory Visit (HOSPITAL_COMMUNITY): Payer: 59 | Admitting: Physician Assistant

## 2021-03-17 ENCOUNTER — Ambulatory Visit (HOSPITAL_COMMUNITY): Payer: 59 | Admitting: Anesthesiology

## 2021-03-17 ENCOUNTER — Other Ambulatory Visit: Payer: Self-pay

## 2021-03-17 ENCOUNTER — Ambulatory Visit (HOSPITAL_COMMUNITY)
Admission: RE | Admit: 2021-03-17 | Discharge: 2021-03-17 | Disposition: A | Discharge status: Home / Self Care | Payer: 59 | Attending: Orthopedic Surgery | Admitting: Orthopedic Surgery

## 2021-03-17 DIAGNOSIS — M75121 Complete rotator cuff tear or rupture of right shoulder, not specified as traumatic: Secondary | ICD-10-CM

## 2021-03-17 DIAGNOSIS — S43431D Superior glenoid labrum lesion of right shoulder, subsequent encounter: Secondary | ICD-10-CM | POA: Diagnosis not present

## 2021-03-17 DIAGNOSIS — Z01818 Encounter for other preprocedural examination: Secondary | ICD-10-CM

## 2021-03-17 DIAGNOSIS — M75101 Unspecified rotator cuff tear or rupture of right shoulder, not specified as traumatic: Secondary | ICD-10-CM | POA: Diagnosis present

## 2021-03-17 DIAGNOSIS — M7521 Bicipital tendinitis, right shoulder: Secondary | ICD-10-CM | POA: Diagnosis not present

## 2021-03-17 DIAGNOSIS — S43431A Superior glenoid labrum lesion of right shoulder, initial encounter: Secondary | ICD-10-CM

## 2021-03-17 HISTORY — PX: SHOULDER ARTHROSCOPY WITH OPEN ROTATOR CUFF REPAIR AND DISTAL CLAVICLE ACROMINECTOMY: SHX5683

## 2021-03-17 LAB — POCT PREGNANCY, URINE: Preg Test, Ur: NEGATIVE

## 2021-03-17 SURGERY — SHOULDER ARTHROSCOPY WITH OPEN ROTATOR CUFF REPAIR AND DISTAL CLAVICLE ACROMINECTOMY
Anesthesia: General | Site: Shoulder | Laterality: Right

## 2021-03-17 MED ORDER — DEXAMETHASONE SODIUM PHOSPHATE 10 MG/ML IJ SOLN
INTRAMUSCULAR | Status: AC
  Filled 2021-03-17: qty 1

## 2021-03-17 MED ORDER — MIDAZOLAM HCL 2 MG/2ML IJ SOLN
2.0000 mg | Freq: Once | INTRAMUSCULAR | Status: AC
Start: 2021-03-17 — End: 2021-03-17

## 2021-03-17 MED ORDER — POVIDONE-IODINE 7.5 % EX SOLN
Freq: Once | CUTANEOUS | Status: AC
  Filled 2021-03-17: qty 118

## 2021-03-17 MED ORDER — PHENYLEPHRINE 40 MCG/ML (10ML) SYRINGE FOR IV PUSH (FOR BLOOD PRESSURE SUPPORT)
PREFILLED_SYRINGE | INTRAVENOUS | Status: AC
  Filled 2021-03-17: qty 10

## 2021-03-17 MED ORDER — FENTANYL CITRATE (PF) 250 MCG/5ML IJ SOLN
INTRAMUSCULAR | Status: AC
  Filled 2021-03-17: qty 5

## 2021-03-17 MED ORDER — ACETAMINOPHEN 160 MG/5ML PO SOLN: 325.0000 mg | ORAL | Status: DC | PRN

## 2021-03-17 MED ORDER — SUGAMMADEX SODIUM 200 MG/2ML IV SOLN
INTRAVENOUS | Status: DC | PRN
  Administered 2021-03-17: 200 mg via INTRAVENOUS

## 2021-03-17 MED ORDER — ONDANSETRON HCL 4 MG/2ML IJ SOLN
INTRAMUSCULAR | Status: AC
  Filled 2021-03-17: qty 2

## 2021-03-17 MED ORDER — FENTANYL CITRATE (PF) 100 MCG/2ML IJ SOLN: 25.0000 ug | INTRAMUSCULAR | Status: DC | PRN

## 2021-03-17 MED ORDER — ONDANSETRON HCL 4 MG/2ML IJ SOLN
INTRAMUSCULAR | Status: DC | PRN
  Administered 2021-03-17: 4 mg via INTRAVENOUS

## 2021-03-17 MED ORDER — OXYCODONE HCL 5 MG PO TABS
ORAL_TABLET | ORAL | Status: AC
  Filled 2021-03-17: qty 1

## 2021-03-17 MED ORDER — LIDOCAINE 2% (20 MG/ML) 5 ML SYRINGE
INTRAMUSCULAR | Status: DC | PRN
  Administered 2021-03-17: 100 mg via INTRAVENOUS

## 2021-03-17 MED ORDER — FENTANYL CITRATE (PF) 100 MCG/2ML IJ SOLN: 100.0000 ug | Freq: Once | INTRAMUSCULAR | Status: AC

## 2021-03-17 MED ORDER — MEPERIDINE HCL 25 MG/ML IJ SOLN: 6.2500 mg | INTRAMUSCULAR | Status: DC | PRN

## 2021-03-17 MED ORDER — PROPOFOL 10 MG/ML IV BOLUS
INTRAVENOUS | Status: DC | PRN
  Administered 2021-03-17: 150 mg via INTRAVENOUS

## 2021-03-17 MED ORDER — OXYCODONE HCL 5 MG/5ML PO SOLN: 5.0000 mg | Freq: Once | ORAL | Status: AC | PRN

## 2021-03-17 MED ORDER — PHENYLEPHRINE HCL-NACL 20-0.9 MG/250ML-% IV SOLN
INTRAVENOUS | Status: DC | PRN
Start: 2021-03-17 — End: 2021-03-17
  Administered 2021-03-17: 40 ug/min via INTRAVENOUS

## 2021-03-17 MED ORDER — CHLORHEXIDINE GLUCONATE 0.12 % MT SOLN
15.0000 mL | Freq: Once | OROMUCOSAL | Status: AC
  Administered 2021-03-17: 15 mL via OROMUCOSAL
  Filled 2021-03-17: qty 15

## 2021-03-17 MED ORDER — LACTATED RINGERS IV SOLN: INTRAVENOUS | Status: DC

## 2021-03-17 MED ORDER — DEXAMETHASONE SODIUM PHOSPHATE 10 MG/ML IJ SOLN
INTRAMUSCULAR | Status: DC | PRN
  Administered 2021-03-17: 10 mg via INTRAVENOUS

## 2021-03-17 MED ORDER — ROCURONIUM BROMIDE 10 MG/ML (PF) SYRINGE
PREFILLED_SYRINGE | INTRAVENOUS | Status: DC | PRN
  Administered 2021-03-17: 60 mg via INTRAVENOUS
  Administered 2021-03-17: 20 mg via INTRAVENOUS

## 2021-03-17 MED ORDER — VANCOMYCIN HCL 1000 MG IV SOLR
INTRAVENOUS | Status: AC
  Filled 2021-03-17: qty 20

## 2021-03-17 MED ORDER — ONDANSETRON HCL 4 MG/2ML IJ SOLN: 4.0000 mg | Freq: Once | INTRAMUSCULAR | Status: DC | PRN

## 2021-03-17 MED ORDER — MIDAZOLAM HCL 2 MG/2ML IJ SOLN
INTRAMUSCULAR | Status: AC
  Filled 2021-03-17: qty 2

## 2021-03-17 MED ORDER — PHENYLEPHRINE 40 MCG/ML (10ML) SYRINGE FOR IV PUSH (FOR BLOOD PRESSURE SUPPORT)
PREFILLED_SYRINGE | INTRAVENOUS | Status: DC | PRN
  Administered 2021-03-17: 120 ug via INTRAVENOUS

## 2021-03-17 MED ORDER — MIDAZOLAM HCL 2 MG/2ML IJ SOLN
INTRAMUSCULAR | Status: AC
  Administered 2021-03-17: 2 mg via INTRAVENOUS
  Filled 2021-03-17: qty 2

## 2021-03-17 MED ORDER — OXYCODONE HCL 5 MG PO TABS
5.0000 mg | ORAL_TABLET | Freq: Once | ORAL | Status: AC | PRN
  Administered 2021-03-17: 5 mg via ORAL

## 2021-03-17 MED ORDER — POVIDONE-IODINE 10 % EX SWAB
2.0000 "application " | Freq: Once | CUTANEOUS | Status: AC
  Administered 2021-03-17: 2 via TOPICAL

## 2021-03-17 MED ORDER — ACETAMINOPHEN 325 MG PO TABS: 325.0000 mg | ORAL_TABLET | ORAL | Status: DC | PRN

## 2021-03-17 MED ORDER — FENTANYL CITRATE (PF) 100 MCG/2ML IJ SOLN
INTRAMUSCULAR | Status: AC
  Filled 2021-03-17: qty 2

## 2021-03-17 MED ORDER — FENTANYL CITRATE (PF) 100 MCG/2ML IJ SOLN
INTRAMUSCULAR | Status: AC
  Administered 2021-03-17: 100 ug via INTRAVENOUS
  Filled 2021-03-17: qty 2

## 2021-03-17 MED ORDER — ORAL CARE MOUTH RINSE: 15.0000 mL | Freq: Once | OROMUCOSAL | Status: AC

## 2021-03-17 MED ORDER — PROPOFOL 10 MG/ML IV BOLUS
INTRAVENOUS | Status: AC
  Filled 2021-03-17: qty 20

## 2021-03-17 MED ORDER — BUPIVACAINE HCL (PF) 0.25 % IJ SOLN
INTRAMUSCULAR | Status: AC
  Filled 2021-03-17: qty 30

## 2021-03-17 MED ORDER — BUPIVACAINE-EPINEPHRINE (PF) 0.5% -1:200000 IJ SOLN
INTRAMUSCULAR | Status: DC | PRN
Start: 2021-03-17 — End: 2021-03-17
  Administered 2021-03-17: 15 mL via PERINEURAL

## 2021-03-17 MED ORDER — EPINEPHRINE PF 1 MG/ML IJ SOLN
INTRAMUSCULAR | Status: DC | PRN
  Administered 2021-03-17: 1 mg

## 2021-03-17 MED ORDER — CEFAZOLIN SODIUM-DEXTROSE 2-4 GM/100ML-% IV SOLN
2.0000 g | INTRAVENOUS | Status: AC
  Administered 2021-03-17: 2 g via INTRAVENOUS
  Filled 2021-03-17: qty 100

## 2021-03-17 MED ORDER — CELECOXIB 100 MG PO CAPS: 100.0000 mg | ORAL_CAPSULE | Freq: Two times a day (BID) | ORAL | 0 refills | Status: DC

## 2021-03-17 MED ORDER — EPINEPHRINE PF 1 MG/ML IJ SOLN
INTRAMUSCULAR | Status: AC
  Filled 2021-03-17: qty 5

## 2021-03-17 MED ORDER — BUPIVACAINE LIPOSOME 1.3 % IJ SUSP
INTRAMUSCULAR | Status: DC | PRN
Start: 2021-03-17 — End: 2021-03-17
  Administered 2021-03-17: 10 mL via PERINEURAL

## 2021-03-17 MED ORDER — OXYCODONE-ACETAMINOPHEN 5-325 MG PO TABS: 1.0000 | ORAL_TABLET | ORAL | 0 refills | Status: DC | PRN

## 2021-03-17 MED ORDER — SODIUM CHLORIDE 0.9 % IR SOLN
Status: DC | PRN
  Administered 2021-03-17: 3000 mL

## 2021-03-17 SURGICAL SUPPLY — 65 items
ANCHOR FBRTK 2.6 SUTURETAP 1.3 (Anchor) ×2 IMPLANT
ANCHOR SUT 1.8 FIBERTAK SB KL (Anchor) ×1 IMPLANT
ANCHOR SWIVELOCK BIO 4.75X19.1 (Anchor) ×2 IMPLANT
BAG COUNTER SPONGE SURGICOUNT (BAG) ×2 IMPLANT
BLADE EXCALIBUR 4.0X13 (MISCELLANEOUS) IMPLANT
BLADE SURG 11 STRL SS (BLADE) ×2 IMPLANT
BUR OVAL 6.0 (BURR) IMPLANT
CLSR STERI-STRIP ANTIMIC 1/2X4 (GAUZE/BANDAGES/DRESSINGS) ×1 IMPLANT
COVER SURGICAL LIGHT HANDLE (MISCELLANEOUS) ×2 IMPLANT
DRAPE INCISE IOBAN 66X45 STRL (DRAPES) ×4 IMPLANT
DRAPE STERI 35X30 U-POUCH (DRAPES) ×2 IMPLANT
DRAPE U-SHAPE 47X51 STRL (DRAPES) ×4 IMPLANT
DRSG IV TEGADERM 3.5X4.5 STRL (GAUZE/BANDAGES/DRESSINGS) ×1 IMPLANT
DRSG PAD ABDOMINAL 8X10 ST (GAUZE/BANDAGES/DRESSINGS) ×6 IMPLANT
DRSG TEGADERM 4X4.75 (GAUZE/BANDAGES/DRESSINGS) ×8 IMPLANT
DURAPREP 26ML APPLICATOR (WOUND CARE) ×2 IMPLANT
ELECT REM PT RETURN 9FT ADLT (ELECTROSURGICAL) ×2
ELECTRODE REM PT RTRN 9FT ADLT (ELECTROSURGICAL) ×1 IMPLANT
FILTER STRAW FLUID ASPIR (MISCELLANEOUS) ×2 IMPLANT
GAUZE SPONGE 4X4 12PLY STRL LF (GAUZE/BANDAGES/DRESSINGS) ×2 IMPLANT
GAUZE XEROFORM 1X8 LF (GAUZE/BANDAGES/DRESSINGS) ×2 IMPLANT
GLOVE SRG 8 PF TXTR STRL LF DI (GLOVE) ×1 IMPLANT
GLOVE SURG LTX SZ8 (GLOVE) ×2 IMPLANT
GLOVE SURG UNDER POLY LF SZ8 (GLOVE) ×2
GOWN STRL REUS W/ TWL LRG LVL3 (GOWN DISPOSABLE) ×3 IMPLANT
GOWN STRL REUS W/TWL LRG LVL3 (GOWN DISPOSABLE) ×6
KIT BASIN OR (CUSTOM PROCEDURE TRAY) ×2 IMPLANT
KIT TURNOVER KIT B (KITS) ×2 IMPLANT
MANIFOLD NEPTUNE II (INSTRUMENTS) ×2 IMPLANT
NDL HYPO 25X1 1.5 SAFETY (NEEDLE) ×1 IMPLANT
NDL SCORPION MULTI FIRE (NEEDLE) IMPLANT
NDL SPNL 18GX3.5 QUINCKE PK (NEEDLE) ×1 IMPLANT
NDL SUT 6 .5 CRC .975X.05 MAYO (NEEDLE) IMPLANT
NEEDLE HYPO 25X1 1.5 SAFETY (NEEDLE) ×2 IMPLANT
NEEDLE MAYO TAPER (NEEDLE)
NEEDLE SCORPION MULTI FIRE (NEEDLE) ×2 IMPLANT
NEEDLE SPNL 18GX3.5 QUINCKE PK (NEEDLE) ×2 IMPLANT
NS IRRIG 1000ML POUR BTL (IV SOLUTION) ×2 IMPLANT
PACK SHOULDER (CUSTOM PROCEDURE TRAY) ×2 IMPLANT
PAD ARMBOARD 7.5X6 YLW CONV (MISCELLANEOUS) ×4 IMPLANT
PORT APPOLLO RF 90DEGREE MULTI (SURGICAL WAND) ×1 IMPLANT
RESTRAINT HEAD UNIVERSAL NS (MISCELLANEOUS) ×2 IMPLANT
SLING ARM IMMOBILIZER MED (SOFTGOODS) IMPLANT
SPONGE T-LAP 4X18 ~~LOC~~+RFID (SPONGE) ×4 IMPLANT
STRIP CLOSURE SKIN 1/2X4 (GAUZE/BANDAGES/DRESSINGS) ×2 IMPLANT
SUCTION FRAZIER HANDLE 10FR (MISCELLANEOUS) ×2
SUCTION TUBE FRAZIER 10FR DISP (MISCELLANEOUS) ×1 IMPLANT
SUT ETHILON 3 0 PS 1 (SUTURE) ×2 IMPLANT
SUT FIBERWIRE #2 38 T-5 BLUE (SUTURE)
SUT MNCRL AB 3-0 PS2 18 (SUTURE) ×2 IMPLANT
SUT VIC AB 0 CT1 27 (SUTURE) ×2
SUT VIC AB 0 CT1 27XBRD ANBCTR (SUTURE) ×1 IMPLANT
SUT VIC AB 1 CT1 27 (SUTURE) ×2
SUT VIC AB 1 CT1 27XBRD ANBCTR (SUTURE) ×1 IMPLANT
SUT VIC AB 2-0 CT1 27 (SUTURE) ×2
SUT VIC AB 2-0 CT1 TAPERPNT 27 (SUTURE) ×1 IMPLANT
SUT VICRYL 0 UR6 27IN ABS (SUTURE) IMPLANT
SUTURE FIBERWR #2 38 T-5 BLUE (SUTURE) IMPLANT
SYR 20ML LL LF (SYRINGE) ×4 IMPLANT
SYR 3ML LL SCALE MARK (SYRINGE) ×2 IMPLANT
SYR TB 1ML LUER SLIP (SYRINGE) ×2 IMPLANT
TOWEL GREEN STERILE (TOWEL DISPOSABLE) ×2 IMPLANT
TOWEL GREEN STERILE FF (TOWEL DISPOSABLE) ×2 IMPLANT
TUBING ARTHROSCOPY IRRIG 16FT (MISCELLANEOUS) ×2 IMPLANT
WATER STERILE IRR 1000ML POUR (IV SOLUTION) ×2 IMPLANT

## 2021-03-17 NOTE — Anesthesia Preprocedure Evaluation (Signed)
Anesthesia Evaluation  Patient identified by MRN, date of birth, ID band Patient awake    Reviewed: Allergy & Precautions, H&P , NPO status , Patient's Chart, lab work & pertinent test results, reviewed documented beta blocker date and time   Airway Mallampati: II  TM Distance: >3 FB Neck ROM: full    Dental no notable dental hx.    Pulmonary neg pulmonary ROS,    Pulmonary exam normal breath sounds clear to auscultation       Cardiovascular Exercise Tolerance: Good negative cardio ROS   Rhythm:regular Rate:Normal     Neuro/Psych negative neurological ROS  negative psych ROS   GI/Hepatic negative GI ROS, Neg liver ROS,   Endo/Other  negative endocrine ROS  Renal/GU negative Renal ROS  negative genitourinary   Musculoskeletal   Abdominal   Peds  Hematology negative hematology ROS (+)   Anesthesia Other Findings   Reproductive/Obstetrics negative OB ROS                             Anesthesia Physical Anesthesia Plan  ASA: 2  Anesthesia Plan: General   Post-op Pain Management:    Induction: Intravenous  PONV Risk Score and Plan: 3 and Ondansetron, Dexamethasone, Treatment may vary due to age or medical condition and Midazolam  Airway Management Planned: Oral ETT and LMA  Additional Equipment: None  Intra-op Plan:   Post-operative Plan: Extubation in OR  Informed Consent: I have reviewed the patients History and Physical, chart, labs and discussed the procedure including the risks, benefits and alternatives for the proposed anesthesia with the patient or authorized representative who has indicated his/her understanding and acceptance.     Dental Advisory Given  Plan Discussed with: CRNA and Anesthesiologist  Anesthesia Plan Comments: (Discussed both nerve block for pain relief post-op and GA; including NV, sore throat, dental injury, and pulmonary complications)         Anesthesia Quick Evaluation

## 2021-03-17 NOTE — Anesthesia Procedure Notes (Signed)
Anesthesia Regional Block: Interscalene brachial plexus block   Pre-Anesthetic Checklist: , timeout performed,  Correct Patient, Correct Site, Correct Laterality,  Correct Procedure, Correct Position, site marked,  Risks and benefits discussed,  Surgical consent,  Pre-op evaluation,  At surgeon's request and post-op pain management  Laterality: Right  Prep: chloraprep       Needles:  Injection technique: Single-shot  Needle Type: Echogenic Stimulator Needle     Needle Length: 5cm  Needle Gauge: 22     Additional Needles:   Procedures:, nerve stimulator,,, ultrasound used (permanent image in chart),,     Nerve Stimulator or Paresthesia:  Response: hand, 0.45 mA  Additional Responses:   Narrative:  Start time: 03/17/2021 12:00 PM End time: 03/17/2021 12:05 PM Injection made incrementally with aspirations every 5 mL.  Performed by: Personally  Anesthesiologist: Bethena Midget, MD  Additional Notes: Functioning IV was confirmed and monitors were applied.  A 6mm 22ga Arrow echogenic stimulator needle was used. Sterile prep and drape,hand hygiene and sterile gloves were used. Ultrasound guidance: relevant anatomy identified, needle position confirmed, local anesthetic spread visualized around nerve(s)., vascular puncture avoided.  Image printed for medical record. Negative aspiration and negative test dose prior to incremental administration of local anesthetic. The patient tolerated the procedure well.

## 2021-03-17 NOTE — Anesthesia Procedure Notes (Signed)
Procedure Name: Intubation Date/Time: 03/17/2021 12:34 PM Performed by: Erick Colace, CRNA Pre-anesthesia Checklist: Patient identified, Emergency Drugs available, Suction available and Patient being monitored Patient Re-evaluated:Patient Re-evaluated prior to induction Oxygen Delivery Method: Circle system utilized Preoxygenation: Pre-oxygenation with 100% oxygen Induction Type: IV induction Ventilation: Mask ventilation without difficulty Laryngoscope Size: Mac and 4 Grade View: Grade I Tube type: Oral Tube size: 7.0 mm Number of attempts: 1 Airway Equipment and Method: Stylet and Oral airway Placement Confirmation: ETT inserted through vocal cords under direct vision, positive ETCO2 and breath sounds checked- equal and bilateral Secured at: 23 cm Tube secured with: Tape Dental Injury: Teeth and Oropharynx as per pre-operative assessment

## 2021-03-17 NOTE — Transfer of Care (Signed)
Immediate Anesthesia Transfer of Care Note  Patient: Carly Lindsey  Procedure(s) Performed: RIGHT SHOULDER ARTHROSCOPY, DEBRIDEMENT, MINI OPEN ROTATOR CUFF TENDON REPAIR, BICEPS TENODESIS (Right: Shoulder)  Patient Location: PACU  Anesthesia Type:General  Level of Consciousness: awake  Airway & Oxygen Therapy: Patient Spontanous Breathing and Patient connected to face mask oxygen  Post-op Assessment: Report given to RN and Post -op Vital signs reviewed and stable  Post vital signs: Reviewed and stable  Last Vitals:  Vitals Value Taken Time  BP 119/80   Temp    Pulse 73 03/17/21 1518  Resp 11 03/17/21 1518  SpO2 98 % 03/17/21 1518  Vitals shown include unvalidated device data.  Last Pain:  Vitals:   03/17/21 1046  TempSrc:   PainSc: 3       Patients Stated Pain Goal: 3 ([id number])  Complications: No notable events documented.

## 2021-03-17 NOTE — H&P (Signed)
PREOPERATIVE H&P  Chief Complaint: "My right shoulder hurts"  HPI: Carly Lindsey is a 46 y.o. female who presents for evaluation of right shoulder pain. It has been worsening. She has failed conservative measures. MRI of the right shoulder from 02/15/2021 shows full-thickness tear of the anterior to mid supraspinatus tendon from its distal insertion with retraction to the level of the humeral head apex.  No muscle edema is present.  She also had an MRI scan on 12/26/2020 which shows same full-thickness tear of the supraspinatus tendon.  No past medical history on file. Past Surgical History:  Procedure Laterality Date   WISDOM TOOTH EXTRACTION     Social History   Socioeconomic History   Marital status: Married    Spouse name: Not on file   Number of children: Not on file   Years of education: Not on file   Highest education level: Not on file  Occupational History   Not on file  Tobacco Use   Smoking status: Never   Smokeless tobacco: Not on file  Substance and Sexual Activity   Alcohol use: Yes    Comment: Occasional   Drug use: No   Sexual activity: Yes    Birth control/protection: None  Other Topics Concern   Not on file  Social History Narrative   Not on file   Social Determinants of Health   Financial Resource Strain: Not on file  Food Insecurity: Not on file  Transportation Needs: Not on file  Physical Activity: Not on file  Stress: Not on file  Social Connections: Not on file   No family history on file. Allergies  Allergen Reactions   Penicillins Nausea And Vomiting   Prior to Admission medications   Medication Sig Start Date End Date Taking? Authorizing Provider  baclofen (LIORESAL) 10 MG tablet Take 10 mg by mouth 3 (three) times daily as needed for muscle spasms.   Yes [provider]  bismuth subsalicylate (PEPTO BISMOL) 262 MG chewable tablet Chew 262-524 mg by mouth as needed.   Yes [provider]  calcium carbonate (TUMS -  DOSED IN MG ELEMENTAL CALCIUM) 500 MG chewable tablet Chew 3 tablets by mouth daily as needed for indigestion or heartburn.   Yes [provider]  naproxen (NAPROSYN) 500 MG tablet Take 500 mg by mouth 2 (two) times daily as needed for pain. 01/17/21  Yes [provider]  pseudoephedrine (SUDAFED) 120 MG 12 hr tablet Take 120 mg by mouth every 12 (twelve) hours as needed for congestion.   Yes [provider]  loratadine (CLARITIN) 10 MG tablet Take 10 mg by mouth daily as needed for allergies.    [provider]     Positive ROS: right shoulder pain  All other systems have been reviewed and were otherwise negative with the exception of those mentioned in the HPI and as above.  Physical Exam: Vitals:   03/17/21 1029  BP: 130/87  Pulse: 94  Resp: 20  Temp: 98.3 F (36.8 C)  SpO2: 98%    General: Alert, no acute distress Cardiovascular: No pedal edema Respiratory: No cyanosis, no use of accessory musculature GI: No organomegaly, abdomen is soft and non-tender Skin: No lesions in the area of chief complaint Neurologic: Sensation intact distally Psychiatric: Patient is competent for consent with normal mood and affect Lymphatic: No axillary or cervical lymphadenopathy  MUSCULOSKELETAL: Ortho exam demonstrates good cervical spine range of motion.  5 out of 5 grip EPL FPL interosseous wrist flexion extension bicep  triceps and deltoid strength.  No restriction of passive range of motion on the right left-hand side but she is sensitive to motion including passive range of motion on the right.  She has pretty reasonable strength infraspinatus supraspinatus and subscap testing bilaterally.  Passive range of motion on the right-hand side is 50/95/160.  No restriction of external rotation at 15 degrees of abduction.  No discrete AC joint tenderness bilaterally.  Assessment/Plan: right shoulder rotator cuff tear Plan for Procedure(s): RIGHT SHOULDER  ARTHROSCOPY, DEBRIDEMENT, MINI OPEN ROTATOR CUFF TENDON REPAIR, BICEPS TENODESIS  Regarding the right shoulder she does have a rotator cuff tear.  I think that something that will likely need to be addressed based on her age.  I think she has to have it addressed before that tendon retracts too much to make it unfixable.   The risk and benefits of that surgery are discussed include not limited to infection nerve vessel damage shoulder stiffness as well as expected rehabilitation course.    The risks benefits and alternatives were discussed with the patient including but not limited to the risks of nonoperative treatment, versus surgical intervention including infection, bleeding, nerve injury, malunion, nonunion, hardware prominence, hardware failure, need for hardware removal, blood clots, cardiopulmonary complications, morbidity, mortality, among others, and they were willing to proceed.  Predicted outcome is good, although there may be at least a six to nine month expected recovery.   Follow-up with the office after 7-10 days for re-check  Julieanne Cotton, PA-C 03/17/2021 11:37 AM

## 2021-03-17 NOTE — Anesthesia Postprocedure Evaluation (Signed)
Anesthesia Post Note  Patient: Ezmay Selke  Procedure(s) Performed: RIGHT SHOULDER ARTHROSCOPY, DEBRIDEMENT, MINI OPEN ROTATOR CUFF TENDON REPAIR, BICEPS TENODESIS (Right: Shoulder)     Patient location during evaluation: PACU Anesthesia Type: General Level of consciousness: awake and alert Pain management: pain level controlled Vital Signs Assessment: post-procedure vital signs reviewed and stable Respiratory status: spontaneous breathing, nonlabored ventilation, respiratory function stable and patient connected to nasal cannula oxygen Cardiovascular status: blood pressure returned to baseline and stable Postop Assessment: no apparent nausea or vomiting Anesthetic complications: no   No notable events documented.  Last Vitals:  Vitals:   03/17/21 1547 03/17/21 1602  BP: 122/89 122/79  Pulse: 76 66  Resp: 14 13  Temp:  36.7 C  SpO2: 99% 100%    Last Pain:  Vitals:   03/17/21 1517  TempSrc:   PainSc: 0-No pain                 Kailen Name

## 2021-03-17 NOTE — Brief Op Note (Signed)
° °  03/17/2021  3:21 PM  PATIENT:  Carly Lindsey  46 y.o. female  PRE-OPERATIVE DIAGNOSIS:  right shoulder rotator cuff tear  POST-OPERATIVE DIAGNOSIS:  right shoulder rotator cuff tear,biceps tendonitis  PROCEDURE:  Procedure(s): RIGHT SHOULDER ARTHROSCOPY, DEBRIDEMENT, MINI OPEN ROTATOR CUFF TENDON REPAIR, BICEPS TENODESIS  SURGEON:  Surgeon(s): August Saucer, Corrie Mckusick, MD  ASSISTANT: magnant pa  ANESTHESIA:   general  EBL: 15 ml    Total I/O In: 1000 [I.V.:1000] Out: 50 [Blood:50]  BLOOD ADMINISTERED: none  DRAINS: none   LOCAL MEDICATIONS USED:  none  SPECIMEN:  No Specimen  COUNTS:  YES  TOURNIQUET:  * No tourniquets in log *  DICTATION: .Other Dictation: Dictation Number [id number]  PLAN OF CARE: Admit to inpatient   PATIENT DISPOSITION:  PACU - hemodynamically stable

## 2021-03-18 ENCOUNTER — Telehealth: Payer: Self-pay | Admitting: Orthopedic Surgery

## 2021-03-18 ENCOUNTER — Telehealth: Payer: Self-pay | Admitting: Surgical

## 2021-03-18 ENCOUNTER — Encounter (HOSPITAL_COMMUNITY): Payer: Self-pay | Admitting: Orthopedic Surgery

## 2021-03-18 ENCOUNTER — Other Ambulatory Visit: Payer: Self-pay | Admitting: Surgical

## 2021-03-18 MED ORDER — HYDROMORPHONE HCL 2 MG PO TABS: 1.0000 mg | ORAL_TABLET | ORAL | 0 refills | Status: AC | PRN

## 2021-03-18 MED ORDER — NAPROXEN 500 MG PO TBEC: 500.0000 mg | DELAYED_RELEASE_TABLET | Freq: Two times a day (BID) | ORAL | 2 refills | Status: AC

## 2021-03-18 NOTE — Telephone Encounter (Signed)
Patient called. Says she is still coughing a lot. Would like someone to call her. Also she wants to know when she should remove the dressing and take a shower? The DC papers did not say. Also she did not sleep well. The pain medication did not help. Her call back number is [phone]
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
## 2021-03-18 NOTE — Telephone Encounter (Signed)
Pt is s/p a right shoulder scope and deb yesterday please see message below and advise.

## 2021-03-18 NOTE — Telephone Encounter (Signed)
I called and spoke with Carly Lindsey.  Informed her about the dressings and showering.  She is having no shortness of breath or chest pain but does have some coughing and sore throat that is likely related to intubation.  I did change her medication from Celebrex and Percocet to Dilaudid and naproxen.  She states naproxen has been more helpful for her in the past.  She will call the office on-call number with any concerns over the weekend.

## 2021-03-18 NOTE — Telephone Encounter (Signed)
Patient's husband Fredrik Cove called advised patient's pain level is going up and he's pretty concerned right now. Fredrik Cove said patient is still coughing a lot. Roger said the CPM machine was delivered today and he's concerned working her shoulder will make the pain worse. Roger asked for a call back as soon as possible.   Please see previous note. The  number to contact patient is [phone]

## 2021-03-18 NOTE — Telephone Encounter (Signed)
I called Geanie and spoke with her.

## 2021-03-19 DIAGNOSIS — M75121 Complete rotator cuff tear or rupture of right shoulder, not specified as traumatic: Secondary | ICD-10-CM

## 2021-03-19 DIAGNOSIS — S43431A Superior glenoid labrum lesion of right shoulder, initial encounter: Secondary | ICD-10-CM

## 2021-03-19 DIAGNOSIS — M7521 Bicipital tendinitis, right shoulder: Secondary | ICD-10-CM

## 2021-03-21 ENCOUNTER — Telehealth: Payer: Self-pay

## 2021-03-21 NOTE — Telephone Encounter (Signed)
I called Carly Lindsey and spoke with her.  She is doing well and pain is better controlled.  She is following up later this week.

## 2021-03-21 NOTE — Telephone Encounter (Signed)
Patient would like to know if it is normal for her Biceps to be really bruised after surgery and would like to know if she could take something natural for her pain instead of the Rx's that she was prescribed?  Stated that the Rx's make her feel very loopy and will not be taking them after today.  Patient had right shoulder surgery on 03/17/2021.  CB# [phone].  Please advise.  Thank you.

## 2021-03-21 NOTE — Telephone Encounter (Signed)
tyvm

## 2021-03-25 ENCOUNTER — Encounter: Payer: Self-pay | Admitting: Orthopedic Surgery

## 2021-03-25 ENCOUNTER — Other Ambulatory Visit: Payer: Self-pay

## 2021-03-25 ENCOUNTER — Ambulatory Visit (INDEPENDENT_AMBULATORY_CARE_PROVIDER_SITE_OTHER): Payer: 59 | Admitting: Orthopedic Surgery

## 2021-03-25 DIAGNOSIS — S46011A Strain of muscle(s) and tendon(s) of the rotator cuff of right shoulder, initial encounter: Secondary | ICD-10-CM

## 2021-03-25 NOTE — Progress Notes (Signed)
° °  Post-Op Visit Note   Patient: Carly Lindsey           Date of Birth: 06-01-1975           MRN: [id number] Visit Date: 03/25/2021 PCP: Patient, No Pcp Per (Inactive)   Assessment & Plan:  Chief Complaint:  Chief Complaint  Patient presents with   Right Shoulder - Routine Post Op   Visit Diagnoses:  1. Traumatic complete tear of right rotator cuff, initial encounter     Plan: Patient presents 1 week out follow-up right shoulder arthroscopy with biceps tenodesis repair of thumb sized rotator cuff tear.  Taking ibuprofen for pain.  Did not get much relief from oxycodone.  CPM on 70.  This is the black brace.  Plan is to DC the sling next weekend.  Start physical therapy next week for passive range of motion and isometrics with manual PT to increase range of motion.  No lifting with that right arm when she comes out of the sling.  2-week return for clinical recheck on motion.  Deltoid fires nicely today.  Rotator cuff strength predictably weak but there is no coarse grinding with passive range of motion.  Follow-Up Instructions: Return in about 2 weeks (around 04/08/2021).   Orders:  Orders Placed This Encounter  Procedures   Ambulatory referral to Physical Therapy   No orders of the defined types were placed in this encounter.   Imaging: No results found.  PMFS History: Patient Active Problem List   Diagnosis Date Noted   Complete tear of right rotator cuff    Biceps tendonitis on right    Superior glenoid labrum lesion of right shoulder    History reviewed. No pertinent past medical history.  History reviewed. No pertinent family history.  Past Surgical History:  Procedure Laterality Date   SHOULDER ARTHROSCOPY WITH OPEN ROTATOR CUFF REPAIR AND DISTAL CLAVICLE ACROMINECTOMY Right 03/17/2021   Procedure: RIGHT SHOULDER ARTHROSCOPY, DEBRIDEMENT, MINI OPEN ROTATOR CUFF TENDON REPAIR, BICEPS TENODESIS;  Surgeon: Cammy Copa, MD;  Location: [facility];  Service:  Orthopedics;  Laterality: Right;   WISDOM TOOTH EXTRACTION     Social History   Occupational History   Not on file  Tobacco Use   Smoking status: Never   Smokeless tobacco: Not on file  Substance and Sexual Activity   Alcohol use: Yes    Comment: Occasional   Drug use: No   Sexual activity: Yes    Birth control/protection: None
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
## 2021-03-31 ENCOUNTER — Encounter: Payer: Self-pay | Admitting: Physical Therapy

## 2021-03-31 ENCOUNTER — Ambulatory Visit (INDEPENDENT_AMBULATORY_CARE_PROVIDER_SITE_OTHER): Payer: 59 | Admitting: Physical Therapy

## 2021-03-31 ENCOUNTER — Other Ambulatory Visit: Payer: Self-pay

## 2021-03-31 DIAGNOSIS — M6281 Muscle weakness (generalized): Secondary | ICD-10-CM | POA: Diagnosis not present

## 2021-03-31 DIAGNOSIS — M25611 Stiffness of right shoulder, not elsewhere classified: Secondary | ICD-10-CM

## 2021-03-31 DIAGNOSIS — M62838 Other muscle spasm: Secondary | ICD-10-CM | POA: Diagnosis not present

## 2021-03-31 DIAGNOSIS — M25511 Pain in right shoulder: Secondary | ICD-10-CM

## 2021-03-31 DIAGNOSIS — R600 Localized edema: Secondary | ICD-10-CM

## 2021-04-08 ENCOUNTER — Encounter: Payer: 59 | Admitting: Rehabilitative and Restorative Service Providers"

## 2021-04-08 ENCOUNTER — Other Ambulatory Visit: Payer: Self-pay

## 2021-04-08 ENCOUNTER — Ambulatory Visit (INDEPENDENT_AMBULATORY_CARE_PROVIDER_SITE_OTHER): Payer: 59 | Admitting: Surgical

## 2021-04-08 DIAGNOSIS — S46011A Strain of muscle(s) and tendon(s) of the rotator cuff of right shoulder, initial encounter: Secondary | ICD-10-CM

## 2021-04-09 ENCOUNTER — Encounter: Payer: Self-pay | Admitting: Surgical

## 2021-04-09 NOTE — Progress Notes (Signed)
° °  Post-Op Visit Note   Patient: Carly Lindsey           Date of Birth: 1975/09/20           MRN: [id number] Visit Date: 04/08/2021 PCP: Patient, No Pcp Per (Inactive)   Assessment & Plan:  Chief Complaint:  Chief Complaint  Patient presents with   Right Shoulder - Routine Post Op    03/17/21 (3w 1d)Right Shoulder Arthroscopy, Debridement, Mini Open Rotator Cuff Tendon Repair, Biceps Tenodesis - Right     Visit Diagnoses: No diagnosis found.  Plan: Patient is a 46 year old female who presents s/p right shoulder arthroscopy with mini open rotator cuff tear repair and biceps tenodesis on 03/17/2021.  She is about 3 weeks out from procedure.  Pain is overall controlled but she still has some difficulty with sleeping.  Only has to take ibuprofen 600 mg as needed which is typically about once per day.  She started physical therapy last week but has not had any appointment this week.  She will resume PT next week.  She is continuing to use the CPM machine is up to 90 degrees on this.  On exam, she has range of motion 25 degrees external rotation, 75 degrees AB duction, 110 degrees forward flexion.  Axillary nerve is intact with deltoid firing.  Incisions are healing well without evidence of infection.  No coarse grinding or crepitus noted with passive motion of the shoulder.  Plan is continue with physical therapy and home exercise program.  Cautioned her against lifting anything with the operative arm.  No lifting the arm under its own power.  She is okay to be out of the sling.  Follow-up in 3 weeks for clinical recheck and initiation of active range of motion and early rotator cuff strengthening exercises at that point.  Follow-Up Instructions: No follow-ups on file.   Orders:  No orders of the defined types were placed in this encounter.  No orders of the defined types were placed in this encounter.   Imaging: No results found.  PMFS History: Patient Active Problem List   Diagnosis  Date Noted   Complete tear of right rotator cuff    Biceps tendonitis on right    Superior glenoid labrum lesion of right shoulder    No past medical history on file.  No family history on file.  Past Surgical History:  Procedure Laterality Date   SHOULDER ARTHROSCOPY WITH OPEN ROTATOR CUFF REPAIR AND DISTAL CLAVICLE ACROMINECTOMY Right 03/17/2021   Procedure: RIGHT SHOULDER ARTHROSCOPY, DEBRIDEMENT, MINI OPEN ROTATOR CUFF TENDON REPAIR, BICEPS TENODESIS;  Surgeon: Meredith Pel, MD;  Location: [facility];  Service: Orthopedics;  Laterality: Right;   WISDOM TOOTH EXTRACTION     Social History   Occupational History   Not on file  Tobacco Use   Smoking status: Never   Smokeless tobacco: Not on file  Substance and Sexual Activity   Alcohol use: Yes    Comment: Occasional   Drug use: No   Sexual activity: Yes    Birth control/protection: None

## 2021-04-13 ENCOUNTER — Encounter: Payer: Self-pay | Admitting: Rehabilitative and Restorative Service Providers"

## 2021-04-13 ENCOUNTER — Ambulatory Visit (INDEPENDENT_AMBULATORY_CARE_PROVIDER_SITE_OTHER): Payer: 59 | Admitting: Rehabilitative and Restorative Service Providers"

## 2021-04-13 ENCOUNTER — Other Ambulatory Visit: Payer: Self-pay

## 2021-04-13 DIAGNOSIS — M25511 Pain in right shoulder: Secondary | ICD-10-CM | POA: Diagnosis not present

## 2021-04-13 DIAGNOSIS — R600 Localized edema: Secondary | ICD-10-CM

## 2021-04-13 DIAGNOSIS — M6281 Muscle weakness (generalized): Secondary | ICD-10-CM

## 2021-04-13 DIAGNOSIS — M62838 Other muscle spasm: Secondary | ICD-10-CM

## 2021-04-13 DIAGNOSIS — M25611 Stiffness of right shoulder, not elsewhere classified: Secondary | ICD-10-CM

## 2021-04-13 NOTE — Therapy (Signed)
?OUTPATIENT PHYSICAL THERAPY TREATMENT NOTE ? ? ?Patient Name: Carly Lindsey ?MRN: [id number] ?DOB:04-26-1975, 46 y.o., female ?Today's Date: 04/13/2021 ? ? PT End of Session - 04/13/21 1344   ? ? Visit Number 2   ? Number of Visits 12   ? Date for PT Re-Evaluation 05/12/21   ? Authorization Type Aetna NAP   ? PT Start Time 1344   ? PT Stop Time 1426   ? PT Time Calculation (min) 42 min   ? Activity Tolerance Patient tolerated treatment well   ? Behavior During Therapy [facility] for tasks assessed/performed   ? ?  ?  ? ?  ? ? ?History reviewed. No pertinent past medical history. ?Past Surgical History:  ?Procedure Laterality Date  ? SHOULDER ARTHROSCOPY WITH OPEN ROTATOR CUFF REPAIR AND DISTAL CLAVICLE ACROMINECTOMY Right 03/17/2021  ? Procedure: RIGHT SHOULDER ARTHROSCOPY, DEBRIDEMENT, MINI OPEN ROTATOR CUFF TENDON REPAIR, BICEPS TENODESIS;  Surgeon: Meredith Pel, MD;  Location: [place];  Service: Orthopedics;  Laterality: Right;  ? WISDOM TOOTH EXTRACTION    ? ?Patient Active Problem List  ? Diagnosis Date Noted  ? Complete tear of right rotator cuff   ? Biceps tendonitis on right   ? Superior glenoid labrum lesion of right shoulder   ? ? ?REFERRING DIAG:  S46.011A (ICD-10-CM) - Traumatic complete tear of right rotator cuff, initial encounter  ? ?REFERRING PROVIDER: Meredith Pel, MD ? ?ONSET DATE: 11/24/21 (surgery 03/17/2021) ? ?THERAPY DIAG:  ?Stiffness of right shoulder, not elsewhere classified ? ?Acute pain of right shoulder ? ?Muscle weakness (generalized) ? ?Other muscle spasm ? ?Localized edema ? ?PERTINENT HISTORY: unremarkable ? ?PRECAUTIONS: PROM and isometrics 2x/wk for 6 wks (05/06/2021) ? ?SUBJECTIVE:  Pt indicated she tripped over a cord and felt a jerking movement in arm that hurt (no fall).   Improving slowly in sleeping.  ? ?PAIN:  ?Are you having pain? Yes ?NPRS scale: 4/10 ?Pain location: shoulder  ?Pain orientation: Rt ?PAIN TYPE: surgery ?Pain description: throbbing  ?Aggravating factors:  jerking movement when tripping ?Relieving factors: OTC ? ? ? ? ?OBJECTIVE:  ?  ?PATIENT SURVEYS:  ?     03/31/2021 FOTO 41 (predicted 68) ?  ?COGNITION: ?         03/31/2021 Overall cognitive status: Within functional limits for tasks assessed ?                              ?SENSATION: ?         03/31/2021 ?Light touch: Appears intact ?         Stereognosis: Appears intact ?         Hot/Cold: Appears intact ?         Proprioception: Appears intact ?  ?POSTURE: ?03/31/2021 ?Depressed right shoulder; forward shoulders bil ?  ?UPPER EXTREMITY AROM/PROM: ?  ?A/PROM Right ?03/31/2021 Left ?03/31/2021 Right ?04/13/2021 ?All PROM in supine with pain limited end range/muscle guarding  ?Shoulder flexion P:103 A:148  ?P: 165 105  ?Shoulder extension P:30 A:40   ?Shoulder abduction P:78 A:180 78  ?Shoulder adduction       ?Shoulder internal rotation P:49 A:60 ?P:80 34 (in 30 deg abduction)  ?Shoulder external rotation P:20 A:90 10 (in 30 deg abduction  ?Elbow flexion       ?Elbow extension       ?Wrist flexion       ?Wrist extension       ?Wrist ulnar deviation       ?  Wrist radial deviation       ?Wrist pronation       ?Wrist supination       ?(Blank rows = not tested) ?  ?UPPER EXTREMITY MMT: ?  ?MMT Right ?03/31/2021 Left ?03/31/2021  ?Shoulder flexion      ?Shoulder extension      ?Shoulder abduction      ?Shoulder adduction      ?Shoulder internal rotation      ?Shoulder external rotation      ?Middle trapezius      ?Lower trapezius      ?Elbow flexion      ?Elbow extension      ?Wrist flexion      ?Wrist extension      ?Wrist ulnar deviation      ?Wrist radial deviation      ?Wrist pronation      ?Wrist supination      ?Grip strength (lbs)      ?(Blank rows = not tested) ?  ?PALPATION:  ?03/31/2021 ?Right UT, biceps, triceps, supra and infraspinatus muscle bellies ?           ?TODAY'S TREATMENT:  ?04/13/2021: ? Therex: Review of existing HEP c cues for technique adjustments to optimize.  ?Supine passive Rt shoulder flexion c Lt hand  movement x 15 ?   Supine passive Rt shoulder ER in 30 deg abduction c wand 10 sec 2 x 5 ?   Seated scapular retraction 5 sec hold x 10 ?   Standing Isometric holds in doorway Rt shoulder 5 sec x 12 in flexion, abd, er, ir  ? ? Manual: Supine Rt GH inferior mobs in flexion, scaption and abduction g3.  Mobilization c movement into ER c posterior glide to Rt gh joint ? ? ? ?03/31/2021 ?TE: shoulder shrugs and retraction; shoulder isometrics all planes 10 sec hold x 5 ?  ?  ?PATIENT EDUCATION: ?04/13/2021 ?Education details: HEP progression, emphasis on passive movement in stretching ?Person educated: Patient ?Education method: Explanation, Demonstration, Tactile cues, Verbal cues, and Handouts ?Education comprehension: verbalized understanding and returned demonstration ?  ?  ?HOME EXERCISE PROGRAM: ?04/13/2021:  ?Access Code: [email or web address] ?URL: [email or web address] ?Date: 04/13/2021 ?Prepared by: Scot Jun ? ?Exercises ?Seated Scapular Retraction - 1 x daily - 7 x weekly - 1 sets - 10 reps - 2-3 sec hold ?Isometric Shoulder Flexion at Wall - 1 x daily - 7 x weekly - 1 sets - 5 reps - 10 sec hold ?Isometric Shoulder Abduction - Arm Straight at Wall (Mirrored) - 1 x daily - 7 x weekly - 1 sets - 5 reps - 10 sec hold ?Isometric Shoulder Extension at Wall (Mirrored) - 1 x daily - 7 x weekly - 1 sets - 5 reps - 10 sec hold ?Standing Isometric Shoulder Internal Rotation at Doorway - 1 x daily - 7 x weekly - 1 sets - 5 reps - 10 sec hold ?Standing Isometric Shoulder External Rotation with Doorway (Mirrored) - 1 x daily - 7 x weekly - 1 sets - 5 reps - 10 sec hold ?Supine Shoulder Flexion PROM (Mirrored) - 2-3 x daily - 7 x weekly - 1-2 sets - 10 reps - 5 hold ?Supine Shoulder External Rotation with [place] (Mirrored) - 2-3 x daily - 7 x weekly - 1-2 sets - 5 reps - 10 hold ? ?  ?  ?ASSESSMENT: ?  ?CLINICAL IMPRESSION: ?Pt continued to present c restricted passive mobility of Rt shoulder c pain restriction noted  primary  at this time.  Continued skilled PT services c progression as tolerated indicated at this time.  PROM c isometrics per referral at this time.  ? ? ?  ?ACTIVITY LIMITATIONS cleaning, driving, meal prep, occupation, and laundry.  ?   ?  ?REHAB POTENTIAL: Excellent ?  ?CLINICAL DECISION MAKING: Stable/uncomplicated ?  ?EVALUATION COMPLEXITY: Low ?  ?  ?GOALS: ?Goals reviewed with patient? Yes ?  ?SHORT TERM GOALS: ?  ?STG Name Target Date Goal status  ?1 Independent with initial HEP ? 04/14/21 MET ?Assessed 04/13/2021  ?  ?LONG TERM GOALS:  ?  ?LTG Name Target Date Goal status  ?1 Independent with final HEP ? 05/12/21 INITIAL  ?2 FOTO score improved to 68 for improved function ?Baseline:41 05/12/21 INITIAL  ?3 Rt shoulder flex and ABD improved to [facility] to complete [place] ADLs ? 05/12/21 INITIAL  ?4 Report pain < 1/10 with ADLs  ? 05/12/21 INITIAL  ?5 Patient able to reach behind her back without difficulty using her right shoulder ? 05/12/21 INITIAL  ?6 Patient to demonstrate improved ER to >= 65 deg to normalize ADLS ? 05/12/21 INITIAL  ?7 Improved Rt shoulder strength to 4+/5 or better  ? 05/12/21 INITIAL  ?  ?  ?  ?PLAN: ?PT FREQUENCY: 2x/week ?  ?PT DURATION: 6 weeks ?  ?PLANNED INTERVENTIONS: Therapeutic exercises, Therapeutic activity, Neuro Muscular re-education, Balance training, Gait training, Patient/Family education, Joint mobilization, Dry Needling, Electrical stimulation, Cryotherapy, Moist heat, Taping, Vasopneumatic device, and Manual therapy ?  ?PLAN FOR NEXT SESSION: PROM, isometrics only at this time, manual mobility improvements c pain relief mob interventions.  ?  ? ? ?Scot Jun, PT, DPT, OCS, ATC ?04/13/21  2:21 PM ? ? ? ?  ? ?

## 2021-04-15 ENCOUNTER — Encounter: Payer: Self-pay | Admitting: Rehabilitative and Restorative Service Providers"

## 2021-04-15 ENCOUNTER — Ambulatory Visit (INDEPENDENT_AMBULATORY_CARE_PROVIDER_SITE_OTHER): Payer: 59 | Admitting: Rehabilitative and Restorative Service Providers"

## 2021-04-15 ENCOUNTER — Other Ambulatory Visit: Payer: Self-pay

## 2021-04-15 DIAGNOSIS — R600 Localized edema: Secondary | ICD-10-CM

## 2021-04-15 DIAGNOSIS — M62838 Other muscle spasm: Secondary | ICD-10-CM | POA: Diagnosis not present

## 2021-04-15 DIAGNOSIS — M25611 Stiffness of right shoulder, not elsewhere classified: Secondary | ICD-10-CM | POA: Diagnosis not present

## 2021-04-15 DIAGNOSIS — M6281 Muscle weakness (generalized): Secondary | ICD-10-CM

## 2021-04-15 DIAGNOSIS — M25511 Pain in right shoulder: Secondary | ICD-10-CM | POA: Diagnosis not present

## 2021-04-15 NOTE — Therapy (Signed)
?OUTPATIENT PHYSICAL THERAPY TREATMENT NOTE ? ? ?Patient Name: Carly Lindsey ?MRN: [id number] ?DOB:Jul 19, 1975, 46 y.o., female ?Today's Date: 04/15/2021 ? ? PT End of Session - 04/15/21 1102   ? ? Visit Number 3   ? Number of Visits 12   ? Date for PT Re-Evaluation 05/12/21   ? Authorization Type [employer] NAP   ? PT Start Time 1102   ? PT Stop Time 8341   ? PT Time Calculation (min) 40 min   ? Activity Tolerance Patient tolerated treatment well   ? Behavior During Therapy [facility] for tasks assessed/performed   ? ?  ?  ? ?  ? ? ? ?History reviewed. No pertinent past medical history. ?Past Surgical History:  ?Procedure Laterality Date  ? SHOULDER ARTHROSCOPY WITH OPEN ROTATOR CUFF REPAIR AND DISTAL CLAVICLE ACROMINECTOMY Right 03/17/2021  ? Procedure: RIGHT SHOULDER ARTHROSCOPY, DEBRIDEMENT, MINI OPEN ROTATOR CUFF TENDON REPAIR, BICEPS TENODESIS;  Surgeon: Meredith Pel, MD;  Location: [place];  Service: Orthopedics;  Laterality: Right;  ? WISDOM TOOTH EXTRACTION    ? ?Patient Active Problem List  ? Diagnosis Date Noted  ? Complete tear of right rotator cuff   ? Biceps tendonitis on right   ? Superior glenoid labrum lesion of right shoulder   ? ? ?REFERRING DIAG:  S46.011A (ICD-10-CM) - Traumatic complete tear of right rotator cuff, initial encounter  ? ?REFERRING PROVIDER: Meredith Pel, MD ? ?ONSET DATE: 11/24/21 (surgery 03/17/2021) ? ?THERAPY DIAG:  ?Stiffness of right shoulder, not elsewhere classified ? ?Acute pain of right shoulder ? ?Muscle weakness (generalized) ? ?Other muscle spasm ? ?Localized edema ? ?PERTINENT HISTORY: unremarkable ? ?PRECAUTIONS: PROM and isometrics 2x/wk for 6 wks (05/06/2021) ? ?SUBJECTIVE:  Pt indicated consistent symptoms at rest.  ? ?PAIN:  ?Are you having pain? Yes ?NPRS scale: 3/10 ?Pain location: shoulder  ?Pain orientation: Rt ?PAIN TYPE: surgery ?Pain description: throbbing  ?Aggravating factors: end range movmeent  ?Relieving factors: OTC ? ? ? ? ?OBJECTIVE:  ?  ?PATIENT  SURVEYS:  ?     03/31/2021 FOTO 41 (predicted 68) ?  ?COGNITION: ?         03/31/2021 Overall cognitive status: Within functional limits for tasks assessed ?                              ?SENSATION: ?         03/31/2021 ?Light touch: Appears intact ?         Stereognosis: Appears intact ?         Hot/Cold: Appears intact ?         Proprioception: Appears intact ?  ?POSTURE: ?03/31/2021 ?Depressed right shoulder; forward shoulders bil ?  ?UPPER EXTREMITY AROM/PROM: ?  ?A/PROM Right ?03/31/2021 Left ?03/31/2021 Right ?04/13/2021 ?All PROM in supine with pain limited end range/muscle guarding  ?Shoulder flexion P:103 A:148  ?P: 165 105  ?Shoulder extension P:30 A:40   ?Shoulder abduction P:78 A:180 78  ?Shoulder adduction       ?Shoulder internal rotation P:49 A:60 ?P:80 34 (in 30 deg abduction)  ?Shoulder external rotation P:20 A:90 10 (in 30 deg abduction  ?Elbow flexion       ?Elbow extension       ?Wrist flexion       ?Wrist extension       ?Wrist ulnar deviation       ?Wrist radial deviation       ?Wrist pronation       ?  Wrist supination       ?(Blank rows = not tested) ?  ?UPPER EXTREMITY MMT: ?  ?MMT Right ?03/31/2021 Left ?03/31/2021  ?Shoulder flexion      ?Shoulder extension      ?Shoulder abduction      ?Shoulder adduction      ?Shoulder internal rotation      ?Shoulder external rotation      ?Middle trapezius      ?Lower trapezius      ?Elbow flexion      ?Elbow extension      ?Wrist flexion      ?Wrist extension      ?Wrist ulnar deviation      ?Wrist radial deviation      ?Wrist pronation      ?Wrist supination      ?Grip strength (lbs)      ?(Blank rows = not tested) ?  ?PALPATION:  ?03/31/2021 ?Right UT, biceps, triceps, supra and infraspinatus muscle bellies ?           ?TODAY'S TREATMENT:  ?04/15/2021: ? Manual: Supine Rt GH inferior mobs in flexion, scaption and abduction g3.  Mobilization c movement into ER c posterior glide to Rt gh joint ? ? Vasopneumatic: Rt shoulder medium 34 degrees   ? ?04/13/2021: ? Therex: Review of existing HEP c cues for technique adjustments to optimize.  ?Supine passive Rt shoulder flexion c Lt hand movement x 15 ?   Supine passive Rt shoulder ER in 30 deg abduction c wand 10 sec 2 x 5 ?   Seated scapular retraction 5 sec hold x 10 ?   Standing Isometric holds in doorway Rt shoulder 5 sec x 12 in flexion, abd, er, ir  ? ? Manual: Supine Rt GH inferior mobs in flexion, scaption and abduction g3.  Mobilization c movement into ER c posterior glide to Rt gh joint ? ? ? ?03/31/2021 ?TE: shoulder shrugs and retraction; shoulder isometrics all planes 10 sec hold x 5 ?  ?  ?PATIENT EDUCATION: ?04/13/2021 ?Education details: HEP progression, emphasis on passive movement in stretching ?Person educated: Patient ?Education method: Explanation, Demonstration, Tactile cues, Verbal cues, and Handouts ?Education comprehension: verbalized understanding and returned demonstration ?  ?  ?HOME EXERCISE PROGRAM: ?04/13/2021:  ?Access Code: [email or web address] ?URL: [email or web address] ?Date: 04/13/2021 ?Prepared by: Scot Jun ? ?Exercises ?Seated Scapular Retraction - 1 x daily - 7 x weekly - 1 sets - 10 reps - 2-3 sec hold ?Isometric Shoulder Flexion at Wall - 1 x daily - 7 x weekly - 1 sets - 5 reps - 10 sec hold ?Isometric Shoulder Abduction - Arm Straight at Wall (Mirrored) - 1 x daily - 7 x weekly - 1 sets - 5 reps - 10 sec hold ?Isometric Shoulder Extension at Wall (Mirrored) - 1 x daily - 7 x weekly - 1 sets - 5 reps - 10 sec hold ?Standing Isometric Shoulder Internal Rotation at Doorway - 1 x daily - 7 x weekly - 1 sets - 5 reps - 10 sec hold ?Standing Isometric Shoulder External Rotation with Doorway (Mirrored) - 1 x daily - 7 x weekly - 1 sets - 5 reps - 10 sec hold ?Supine Shoulder Flexion PROM (Mirrored) - 2-3 x daily - 7 x weekly - 1-2 sets - 10 reps - 5 hold ?Supine Shoulder External Rotation with Dowel (Mirrored) - 2-3 x daily - 7 x weekly - 1-2 sets - 5 reps - 10 hold ? ?   ?  ?ASSESSMENT: ?  ?  CLINICAL IMPRESSION: ?Continued focus on improving passive range at this time.  End range pain /guarding continued as chief limitation. Continued skilled PT services indicated at this time.  ? ? ?  ?ACTIVITY LIMITATIONS cleaning, driving, meal prep, occupation, and laundry.  ?   ?  ?REHAB POTENTIAL: Excellent ?  ?CLINICAL DECISION MAKING: Stable/uncomplicated ?  ?EVALUATION COMPLEXITY: Low ?  ?  ?GOALS: ?Goals reviewed with patient? Yes ?  ?SHORT TERM GOALS: ?  ?STG Name Target Date Goal status  ?1 Independent with initial HEP ? 04/14/21 MET ?Assessed 04/13/2021  ?  ?LONG TERM GOALS:  ?  ?LTG Name Target Date Goal status  ?1 Independent with final HEP ? 05/12/21 INITIAL  ?2 FOTO score improved to 68 for improved function ?Baseline:41 05/12/21 INITIAL  ?3 Rt shoulder flex and ABD improved to [facility] to complete [place] ADLs ? 05/12/21 INITIAL  ?4 Report pain < 1/10 with ADLs  ? 05/12/21 INITIAL  ?5 Patient able to reach behind her back without difficulty using her right shoulder ? 05/12/21 INITIAL  ?6 Patient to demonstrate improved ER to >= 65 deg to normalize ADLS ? 05/12/21 INITIAL  ?7 Improved Rt shoulder strength to 4+/5 or better  ? 05/12/21 INITIAL  ?  ?  ?  ?PLAN: ?PT FREQUENCY: 2x/week ?  ?PT DURATION: 6 weeks ?  ?PLANNED INTERVENTIONS: Therapeutic exercises, Therapeutic activity, Neuro Muscular re-education, Balance training, Gait training, Patient/Family education, Joint mobilization, Dry Needling, Electrical stimulation, Cryotherapy, Moist heat, Taping, Vasopneumatic device, and Manual therapy ?  ?PLAN FOR NEXT SESSION: PROM, isometrics only at this time, manual mobility improvements c pain relief mob interventions.  Vaso as desired ?  ? ? ?Scot Jun, PT, DPT, OCS, ATC ?04/15/21  11:37 AM ? ? ? ?  ? ?

## 2021-04-19 ENCOUNTER — Encounter: Payer: 59 | Admitting: Rehabilitative and Restorative Service Providers"

## 2021-04-21 ENCOUNTER — Encounter: Payer: Self-pay | Admitting: Rehabilitative and Restorative Service Providers"

## 2021-04-21 ENCOUNTER — Ambulatory Visit (INDEPENDENT_AMBULATORY_CARE_PROVIDER_SITE_OTHER): Payer: 59 | Admitting: Rehabilitative and Restorative Service Providers"

## 2021-04-21 ENCOUNTER — Other Ambulatory Visit: Payer: Self-pay

## 2021-04-21 DIAGNOSIS — M25511 Pain in right shoulder: Secondary | ICD-10-CM

## 2021-04-21 DIAGNOSIS — M62838 Other muscle spasm: Secondary | ICD-10-CM | POA: Diagnosis not present

## 2021-04-21 DIAGNOSIS — M6281 Muscle weakness (generalized): Secondary | ICD-10-CM | POA: Diagnosis not present

## 2021-04-21 DIAGNOSIS — R600 Localized edema: Secondary | ICD-10-CM

## 2021-04-21 DIAGNOSIS — M25611 Stiffness of right shoulder, not elsewhere classified: Secondary | ICD-10-CM | POA: Diagnosis not present

## 2021-04-21 NOTE — Therapy (Signed)
°OUTPATIENT PHYSICAL THERAPY TREATMENT NOTE ° ° °Patient Name: Carly Lindsey °MRN: [id number] °DOB:03/25/1975, 46 y.o., female °Today's Date: 04/21/2021 ° ° PT End of Session - 04/21/21 1109   ° ° Visit Number 4   ° Number of Visits 12   ° Date for PT Re-Evaluation 05/12/21   ° Authorization Type [employer] NAP   ° PT Start Time 1054   ° PT Stop Time 1133   ° PT Time Calculation (min) 39 min   ° Activity Tolerance Patient tolerated treatment well   ° Behavior During Therapy WFL for tasks assessed/performed   ° °  °  ° °  ° ° ° ° °History reviewed. No pertinent past medical history. °Past Surgical History:  °Procedure Laterality Date  ° SHOULDER ARTHROSCOPY WITH OPEN ROTATOR CUFF REPAIR AND DISTAL CLAVICLE ACROMINECTOMY Right 03/17/2021  ° Procedure: RIGHT SHOULDER ARTHROSCOPY, DEBRIDEMENT, MINI OPEN ROTATOR CUFF TENDON REPAIR, BICEPS TENODESIS;  Surgeon: Dean, Gregory [place], MD;  Location: [facility];  Service: Orthopedics;  Laterality: Right;  ° WISDOM TOOTH EXTRACTION    ° °Patient Active Problem List  ° Diagnosis Date Noted  ° Complete tear of right rotator cuff   ° Biceps tendonitis on right   ° Superior glenoid labrum lesion of right shoulder   ° ° °REFERRING DIAG:  S46.011A (ICD-10-CM) - Traumatic complete tear of right rotator cuff, initial encounter  ° °REFERRING PROVIDER: Dean, Gregory [place], MD ° °ONSET DATE: 11/24/21 (surgery 03/17/2021) ° °THERAPY DIAG:  °Stiffness of right shoulder, not elsewhere classified ° °Acute pain of right shoulder ° °Muscle weakness (generalized) ° °Other muscle spasm ° °Localized edema ° °PERTINENT HISTORY: unremarkable ° °PRECAUTIONS: PROM and isometrics 2x/wk for 6 wks (05/06/2021) ° °SUBJECTIVE:  Pt indicated no pain today and has been feeling better.  3/10 at worst in time since last visit.  ° °PAIN:  °Are you having pain? No °NPRS scale: 0/10 °Pain location: shoulder  °Pain orientation: Rt °PAIN TYPE: surgery °Pain description: °Aggravating factors: end range movmeent  °Relieving factors:   ° ° ° ° °OBJECTIVE:  °  °PATIENT SURVEYS:  °     03/31/2021 FOTO 41 (predicted 68) °  °COGNITION: °         03/31/2021 Overall cognitive status: Within functional limits for tasks assessed °                              °SENSATION: °         03/31/2021 °Light touch: Appears intact °         Stereognosis: Appears intact °         Hot/Cold: Appears intact °         Proprioception: Appears intact °  °POSTURE: °03/31/2021 °Depressed right shoulder; forward shoulders bil °  °UPPER EXTREMITY AROM/PROM: °  °A/PROM Right °03/31/2021 Left °03/31/2021 Right °04/13/2021 °All PROM in supine with pain limited end range/muscle guarding Right °04/21/2021 °All PROM in supine  °Shoulder flexion P:103 A:148  °P: 165 105 130  °Shoulder extension P:30 A:40    °Shoulder abduction P:78 A:180 78 110  °Shoulder adduction        °Shoulder internal rotation P:49 A:60 °P:80 34 (in 30 deg abduction)   °Shoulder external rotation P:20 A:90 10 (in 30 deg abduction 50 ( 45 deg abduction)  °Elbow flexion        °Elbow extension        °Wrist flexion        °  Wrist extension        Wrist ulnar deviation        Wrist radial deviation        Wrist pronation        Wrist supination        (Blank rows = not tested)   UPPER EXTREMITY MMT:   MMT Right 03/31/2021 Left 03/31/2021  Shoulder flexion      Shoulder extension      Shoulder abduction      Shoulder adduction      Shoulder internal rotation      Shoulder external rotation      Middle trapezius      Lower trapezius      Elbow flexion      Elbow extension      Wrist flexion      Wrist extension      Wrist ulnar deviation      Wrist radial deviation      Wrist pronation      Wrist supination      Grip strength (lbs)      (Blank rows = not tested)   PALPATION:  03/31/2021 Right UT, biceps, triceps, supra and infraspinatus muscle bellies            TODAY'S TREATMENT:  04/21/2021:  Manual: Supine Rt GH inferior mobs in flexion, scaption and abduction g3.  Mobilization c movement  into ER c posterior glide to Rt gh joint  Therex: Isometric flexion, abduction, ER, IR 10 sec x 5 each way   Vasopneumatic: Rt shoulder medium 34 degrees   04/15/2021:  Manual: Supine Rt GH inferior mobs in flexion, scaption and abduction g3.  Mobilization c movement into ER c posterior glide to Rt gh joint   Vasopneumatic: Rt shoulder medium 34 degrees   04/13/2021:  Therex: Review of existing HEP c cues for technique adjustments to optimize.  Supine passive Rt shoulder flexion c Lt hand movement x 15    Supine passive Rt shoulder ER in 30 deg abduction c wand 10 sec 2 x 5    Seated scapular retraction 5 sec hold x 10    Standing Isometric holds in doorway Rt shoulder 5 sec x 12 in flexion, abd, er, ir    Manual: Supine Rt GH inferior mobs in flexion, scaption and abduction g3.  Mobilization c movement into ER c posterior glide to Rt gh joint    03/31/2021 TE: shoulder shrugs and retraction; shoulder isometrics all planes 10 sec hold x 5     PATIENT EDUCATION: 04/13/2021 Education details: HEP progression, emphasis on passive movement in stretching Person educated: Patient Education method: Explanation, Demonstration, Tactile cues, Verbal cues, and Handouts Education comprehension: verbalized understanding and returned demonstration     HOME EXERCISE PROGRAM: 04/13/2021:  Access Code: [email or web address] URL: [email or web address] Date: 04/13/2021 Prepared by: Scot Jun  Exercises Seated Scapular Retraction - 1 x daily - 7 x weekly - 1 sets - 10 reps - 2-3 sec hold Isometric Shoulder Flexion at Wall - 1 x daily - 7 x weekly - 1 sets - 5 reps - 10 sec hold Isometric Shoulder Abduction - Arm Straight at Wall (Mirrored) - 1 x daily - 7 x weekly - 1 sets - 5 reps - 10 sec hold Isometric Shoulder Extension at Wall (Mirrored) - 1 x daily - 7 x weekly - 1 sets - 5 reps - 10 sec hold Standing Isometric Shoulder Internal Rotation at Doorway - 1 x daily - 7 x weekly -  1 sets - 5  reps - 10 sec hold °Standing Isometric Shoulder External Rotation with Doorway (Mirrored) - 1 x daily - 7 x weekly - 1 sets - 5 reps - 10 sec hold °Supine Shoulder Flexion PROM (Mirrored) - 2-3 x daily - 7 x weekly - 1-2 sets - 10 reps - 5 hold °Supine Shoulder External Rotation with Dowel (Mirrored) - 2-3 x daily - 7 x weekly - 1-2 sets - 5 reps - 10 hold ° °  °  °ASSESSMENT: °  °CLINICAL IMPRESSION: °Good improvement in passive range noted today compared to last measured amount (see objective).  Continued management in clinic and at home c passive range of motion and isometrics per MD protocol at this time.  ° ° °  °ACTIVITY LIMITATIONS cleaning, driving, meal prep, occupation, and laundry.  °   °  °REHAB POTENTIAL: Excellent °  °CLINICAL DECISION MAKING: Stable/uncomplicated °  °EVALUATION COMPLEXITY: Low °  °  °GOALS: °Goals reviewed with patient? Yes °  °SHORT TERM GOALS: °  °STG Name Target Date Goal status  °1 Independent with initial HEP ° 04/14/21 MET °Assessed 04/13/2021  °  °LONG TERM GOALS:  °  °LTG Name Target Date Goal status  °1 Independent with final HEP ° 05/12/21 On going °Assessed 04/21/2021  °2 FOTO score improved to 68 for improved function °Baseline:41 05/12/21 On going °Assessed 04/21/2021  °3 Rt shoulder flex and ABD improved to WFL to complete [place] ADLs ° 05/12/21 On going °Assessed 04/21/2021  °4 Report pain < 1/10 with ADLs  ° 05/12/21 On going °Assessed 04/21/2021  °5 Patient able to reach behind her back without difficulty using her right shoulder ° 05/12/21 On going °Assessed 04/21/2021  °6 Patient to demonstrate improved ER to >= 65 deg to normalize ADLS ° 05/12/21 On going °Assessed 04/21/2021  °7 Improved Rt shoulder strength to 4+/5 or better  ° 05/12/21 On going °Assessed 04/21/2021  °  °  °  °PLAN: °PT FREQUENCY: 2x/week °  °PT DURATION: 6 weeks °  °PLANNED INTERVENTIONS: Therapeutic exercises, Therapeutic activity, Neuro Muscular re-education, Balance training, Gait training, Patient/Family education,  Joint mobilization, Dry Needling, Electrical stimulation, Cryotherapy, Moist heat, Taping, Vasopneumatic device, and Manual therapy °  °PLAN FOR NEXT SESSION: PROM, isometrics only at this time.  Vaso as desired °  ° ° °Michael Wright, PT, DPT, OCS, ATC °04/21/21  11:24 AM ° ° ° °  ° °

## 2021-04-25 ENCOUNTER — Ambulatory Visit (INDEPENDENT_AMBULATORY_CARE_PROVIDER_SITE_OTHER): Payer: 59 | Admitting: Rehabilitative and Restorative Service Providers"

## 2021-04-25 ENCOUNTER — Other Ambulatory Visit: Payer: Self-pay

## 2021-04-25 ENCOUNTER — Encounter: Payer: Self-pay | Admitting: Rehabilitative and Restorative Service Providers"

## 2021-04-25 DIAGNOSIS — R600 Localized edema: Secondary | ICD-10-CM

## 2021-04-25 DIAGNOSIS — M25611 Stiffness of right shoulder, not elsewhere classified: Secondary | ICD-10-CM

## 2021-04-25 DIAGNOSIS — M25511 Pain in right shoulder: Secondary | ICD-10-CM

## 2021-04-25 DIAGNOSIS — M6281 Muscle weakness (generalized): Secondary | ICD-10-CM

## 2021-04-25 DIAGNOSIS — M62838 Other muscle spasm: Secondary | ICD-10-CM

## 2021-04-25 NOTE — Therapy (Signed)
OUTPATIENT PHYSICAL THERAPY TREATMENT NOTE   Patient Name: Carly Lindsey MRN: [id number] DOB:[birth year], 46 y.o., female Today's Date: 04/25/2021   PT End of Session - 04/25/21 1305     Visit Number 5    Number of Visits 12    Date for PT Re-Evaluation 05/12/21    Authorization Type [employer] NAP    PT Start Time 1301    PT Stop Time 1339    PT Time Calculation (min) 38 min    Activity Tolerance Patient tolerated treatment well    Behavior During Therapy [facility] for tasks assessed/performed                History reviewed. No pertinent past medical history. Past Surgical History:  Procedure Laterality Date   SHOULDER ARTHROSCOPY WITH OPEN ROTATOR CUFF REPAIR AND DISTAL CLAVICLE ACROMINECTOMY Right 03/17/2021   Procedure: RIGHT SHOULDER ARTHROSCOPY, DEBRIDEMENT, MINI OPEN ROTATOR CUFF TENDON REPAIR, BICEPS TENODESIS;  Surgeon: Meredith Pel, MD;  Location: [place];  Service: Orthopedics;  Laterality: Right;   WISDOM TOOTH EXTRACTION     Patient Active Problem List   Diagnosis Date Noted   Complete tear of right rotator cuff    Biceps tendonitis on right    Superior glenoid labrum lesion of right shoulder     REFERRING DIAG:  S46.011A (ICD-10-CM) - Traumatic complete tear of right rotator cuff, initial encounter   REFERRING PROVIDER: Meredith Pel, MD  ONSET DATE: 11/24/21 (surgery 03/17/2021)  THERAPY DIAG:  Stiffness of right shoulder, not elsewhere classified  Acute pain of right shoulder  Muscle weakness (generalized)  Other muscle spasm  Localized edema  PERTINENT HISTORY: unremarkable  PRECAUTIONS: PROM and isometrics 2x/wk for 6 wks (05/06/2021)  SUBJECTIVE:  Pt indicated a little 1/10 today.   PAIN:  Are you having pain? No NPRS scale: 1/10 Pain location: shoulder  Pain orientation: Rt PAIN TYPE: surgery Pain description: achy Aggravating factors: sleeping on it  Relieving factors: hadn't tried something specific today     OBJECTIVE:     PATIENT SURVEYS:       03/31/2021 FOTO 41 (predicted 68)   COGNITION:          03/31/2021 Overall cognitive status: Within functional limits for tasks assessed                               SENSATION:          03/31/2021 Light touch: Appears intact          Stereognosis: Appears intact          Hot/Cold: Appears intact          Proprioception: Appears intact   POSTURE: 03/31/2021 Depressed right shoulder; forward shoulders bil   UPPER EXTREMITY AROM/PROM:   A/PROM Right 03/31/2021 Left 03/31/2021 Right 04/13/2021 All PROM in supine with pain limited end range/muscle guarding Right 04/21/2021 All PROM in supine  Shoulder flexion P:103 A:148  P: 165 105 130  Shoulder extension P:30 A:40    Shoulder abduction P:78 A:180 78 110  Shoulder adduction        Shoulder internal rotation P:49 A:60 P:80 34 (in 30 deg abduction)   Shoulder external rotation P:20 A:90 10 (in 30 deg abduction 50 ( 45 deg abduction)  Elbow flexion        Elbow extension        Wrist flexion        Wrist extension  Wrist ulnar deviation        Wrist radial deviation        Wrist pronation        Wrist supination        (Blank rows = not tested)   UPPER EXTREMITY MMT:   MMT Right 03/31/2021 Left 03/31/2021  Shoulder flexion      Shoulder extension      Shoulder abduction      Shoulder adduction      Shoulder internal rotation      Shoulder external rotation      Middle trapezius      Lower trapezius      Elbow flexion      Elbow extension      Wrist flexion      Wrist extension      Wrist ulnar deviation      Wrist radial deviation      Wrist pronation      Wrist supination      Grip strength (lbs)      (Blank rows = not tested)   PALPATION:  03/31/2021 Right UT, biceps, triceps, supra and infraspinatus muscle bellies            TODAY'S TREATMENT:  04/25/2021:  Manual: Supine Rt GH inferior mobs in flexion, scaption and abduction g3.  Mobilization c movement into ER c posterior  glide to Rt gh joint   Therex: Scapular retraction hold 5 sec x 10    Seated Rt arm at side biceps curl green band 20x, triceps 20x (in clinic only)    Green grip web 2-3 sec hold 20x   Neuro re-ed: Reactive isometric holds ER/IR in 20 deg abduction supine c mild to moderate resistances 30 sec bouts   04/21/2021:  Manual: Supine Rt GH inferior mobs in flexion, scaption and abduction g3.  Mobilization c movement into ER c posterior glide to Rt gh joint  Therex: Isometric flexion, abduction, ER, IR 10 sec x 5 each way   Vasopneumatic: Rt shoulder medium 34 degrees   04/15/2021:  Manual: Supine Rt GH inferior mobs in flexion, scaption and abduction g3.  Mobilization c movement into ER c posterior glide to Rt gh joint   Vasopneumatic: Rt shoulder medium 34 degrees   04/13/2021:  Therex: Review of existing HEP c cues for technique adjustments to optimize.  Supine passive Rt shoulder flexion c Lt hand movement x 15    Supine passive Rt shoulder ER in 30 deg abduction c wand 10 sec 2 x 5    Seated scapular retraction 5 sec hold x 10    Standing Isometric holds in doorway Rt shoulder 5 sec x 12 in flexion, abd, er, ir    Manual: Supine Rt GH inferior mobs in flexion, scaption and abduction g3.  Mobilization c movement into ER c posterior glide to Rt gh joint    03/31/2021 TE: shoulder shrugs and retraction; shoulder isometrics all planes 10 sec hold x 5     PATIENT EDUCATION: 04/13/2021 Education details: HEP progression, emphasis on passive movement in stretching Person educated: Patient Education method: Explanation, Demonstration, Tactile cues, Verbal cues, and Handouts Education comprehension: verbalized understanding and returned demonstration     HOME EXERCISE PROGRAM: 04/13/2021:  Access Code: [email or web address] URL: [email or web address] Date: 04/13/2021 Prepared by: Scot Jun  Exercises Seated Scapular Retraction - 1 x daily - 7 x weekly - 1 sets - 10 reps - 2-3 sec  hold Isometric Shoulder Flexion at Wall - 1 x daily -  7 x weekly - 1 sets - 5 reps - 10 sec hold Isometric Shoulder Abduction - Arm Straight at Wall (Mirrored) - 1 x daily - 7 x weekly - 1 sets - 5 reps - 10 sec hold Isometric Shoulder Extension at Wall (Mirrored) - 1 x daily - 7 x weekly - 1 sets - 5 reps - 10 sec hold Standing Isometric Shoulder Internal Rotation at Doorway - 1 x daily - 7 x weekly - 1 sets - 5 reps - 10 sec hold Standing Isometric Shoulder External Rotation with Doorway (Mirrored) - 1 x daily - 7 x weekly - 1 sets - 5 reps - 10 sec hold Supine Shoulder Flexion PROM (Mirrored) - 2-3 x daily - 7 x weekly - 1-2 sets - 10 reps - 5 hold Supine Shoulder External Rotation with Dowel (Mirrored) - 2-3 x daily - 7 x weekly - 1-2 sets - 5 reps - 10 hold      ASSESSMENT:   CLINICAL IMPRESSION: Reactive isometric holds lacking in response time.  Continued progressive PROM gains to benefit Pt in mobility when AAROM progression occurs.      ACTIVITY LIMITATIONS cleaning, driving, meal prep, occupation, and laundry.       REHAB POTENTIAL: Excellent   CLINICAL DECISION MAKING: Stable/uncomplicated   EVALUATION COMPLEXITY: Low     GOALS: Goals reviewed with patient? Yes   SHORT TERM GOALS:   STG Name Target Date Goal status  1 Independent with initial HEP  04/14/21 MET Assessed 04/13/2021    LONG TERM GOALS:    LTG Name Target Date Goal status  1 Independent with final HEP  05/12/21 On going Assessed 04/21/2021  2 FOTO score improved to 68 for improved function Baseline:41 05/12/21 On going Assessed 04/21/2021  3 Rt shoulder flex and ABD improved to [place] to complete [place] ADLs  05/12/21 On going Assessed 04/21/2021  4 Report pain < 1/10 with ADLs   05/12/21 On going Assessed 04/21/2021  5 Patient able to reach behind her back without difficulty using her right shoulder  05/12/21 On going Assessed 04/21/2021  6 Patient to demonstrate improved ER to >= 65 deg to normalize ADLS   5/44/92 On going Assessed 04/21/2021  7 Improved Rt shoulder strength to 4+/5 or better   05/12/21 On going Assessed 04/21/2021        PLAN: PT FREQUENCY: 2x/week   PT DURATION: 6 weeks   PLANNED INTERVENTIONS: Therapeutic exercises, Therapeutic activity, Neuro Muscular re-education, Balance training, Gait training, Patient/Family education, Joint mobilization, Dry Needling, Electrical stimulation, Cryotherapy, Moist heat, Taping, Vasopneumatic device, and Manual therapy   PLAN FOR NEXT SESSION: Recheck objective data for MD visit.   Continue PROM per precautions above.      Scot Jun, PT, DPT, OCS, ATC 04/25/21  1:36 PM

## 2021-04-28 ENCOUNTER — Other Ambulatory Visit: Payer: Self-pay

## 2021-04-28 ENCOUNTER — Ambulatory Visit (INDEPENDENT_AMBULATORY_CARE_PROVIDER_SITE_OTHER): Payer: 59 | Admitting: Rehabilitative and Restorative Service Providers"

## 2021-04-28 ENCOUNTER — Encounter: Payer: Self-pay | Admitting: Rehabilitative and Restorative Service Providers"

## 2021-04-28 DIAGNOSIS — M25611 Stiffness of right shoulder, not elsewhere classified: Secondary | ICD-10-CM

## 2021-04-28 DIAGNOSIS — M25511 Pain in right shoulder: Secondary | ICD-10-CM | POA: Diagnosis not present

## 2021-04-28 DIAGNOSIS — M6281 Muscle weakness (generalized): Secondary | ICD-10-CM

## 2021-04-28 DIAGNOSIS — M62838 Other muscle spasm: Secondary | ICD-10-CM | POA: Diagnosis not present

## 2021-04-28 DIAGNOSIS — R600 Localized edema: Secondary | ICD-10-CM

## 2021-04-28 NOTE — Therapy (Signed)
?OUTPATIENT PHYSICAL THERAPY TREATMENT NOTE ? ? ?Patient Name: Carly Lindsey ?MRN: [id number] ?DOB:[birth year], 46 y.o., female ?Today's Date: 04/28/2021 ? ? PT End of Session - 04/28/21 1130   ? ? Visit Number 6   ? Number of Visits 12   ? Date for PT Re-Evaluation 05/12/21   ? Authorization Type [employer] NAP   ? PT Start Time 1059   ? PT Stop Time 1137   ? PT Time Calculation (min) 38 min   ? Activity Tolerance Patient tolerated treatment well   ? Behavior During Therapy [facility] for tasks assessed/performed   ? ?  ?  ? ?  ? ? ? ? ? ? ?History reviewed. No pertinent past medical history. ?Past Surgical History:  ?Procedure Laterality Date  ? SHOULDER ARTHROSCOPY WITH OPEN ROTATOR CUFF REPAIR AND DISTAL CLAVICLE ACROMINECTOMY Right 03/17/2021  ? Procedure: RIGHT SHOULDER ARTHROSCOPY, DEBRIDEMENT, MINI OPEN ROTATOR CUFF TENDON REPAIR, BICEPS TENODESIS;  Surgeon: Meredith Pel, MD;  Location: [place];  Service: Orthopedics;  Laterality: Right;  ? WISDOM TOOTH EXTRACTION    ? ?Patient Active Problem List  ? Diagnosis Date Noted  ? Complete tear of right rotator cuff   ? Biceps tendonitis on right   ? Superior glenoid labrum lesion of right shoulder   ? ? ?REFERRING DIAG:  S46.011A (ICD-10-CM) - Traumatic complete tear of right rotator cuff, initial encounter  ? ?REFERRING PROVIDER: Meredith Pel, MD ? ?ONSET DATE: 11/24/21 (surgery 03/17/2021) ? ?THERAPY DIAG:  ?Stiffness of right shoulder, not elsewhere classified ? ?Acute pain of right shoulder ? ?Muscle weakness (generalized) ? ?Other muscle spasm ? ?Localized edema ? ?PERTINENT HISTORY: unremarkable ? ?PRECAUTIONS: PROM and isometrics 2x/wk for 6 wks (05/06/2021) ? ?SUBJECTIVE:  Pt indicated a little achy today.  ? ?PAIN:  ?Are you having pain? No ?NPRS scale: 1/10 ?Pain location: shoulder  ?Pain orientation: Rt ?PAIN TYPE: surgery ?Pain description: achy ?Aggravating factors: sleeping on it  ?Relieving factors: hadn't tried something specific  today ? ? ? ? ?OBJECTIVE:  ?  ?PATIENT SURVEYS:  ?     03/31/2021 FOTO 41 (predicted 68) ?  ?COGNITION: ?         03/31/2021 Overall cognitive status: Within functional limits for tasks assessed ?                              ?SENSATION: ?         03/31/2021 ?Light touch: Appears intact ?         Stereognosis: Appears intact ?         Hot/Cold: Appears intact ?         Proprioception: Appears intact ?  ?POSTURE: ?03/31/2021 ?Depressed right shoulder; forward shoulders bil ?  ?UPPER EXTREMITY AROM/PROM: ?  ?A/PROM Right ?03/31/2021 Left ?03/31/2021 Right ?04/13/2021 ?All PROM in supine with pain limited end range/muscle guarding Right ?04/21/2021 ?All PROM in supine Right ?04/28/2021 ?All PROM in supine  ?Shoulder flexion P:103 A:148  ?P: 165 105 130 147  ?Shoulder extension P:30 A:40     ?Shoulder abduction P:78 A:180 78 110 115  ?Shoulder adduction         ?Shoulder internal rotation P:49 A:60 ?P:80 34 (in 30 deg abduction)  52 (in 45 deg abduction)  ?Shoulder external rotation P:20 A:90 10 (in 30 deg abduction 50 ( 45 deg abduction) 52 ( 45 deg abduction)  ?Elbow flexion         ?Elbow  extension         ?Wrist flexion         ?Wrist extension         ?Wrist ulnar deviation         ?Wrist radial deviation         ?Wrist pronation         ?Wrist supination         ?(Blank rows = not tested) ?  ?UPPER EXTREMITY MMT: ?  ?MMT Right ?03/31/2021 Left ?03/31/2021  ?Shoulder flexion      ?Shoulder extension      ?Shoulder abduction      ?Shoulder adduction      ?Shoulder internal rotation      ?Shoulder external rotation      ?Middle trapezius      ?Lower trapezius      ?Elbow flexion      ?Elbow extension      ?Wrist flexion      ?Wrist extension      ?Wrist ulnar deviation      ?Wrist radial deviation      ?Wrist pronation      ?Wrist supination      ?Grip strength (lbs)      ?(Blank rows = not tested) ?  ?PALPATION:  ?03/31/2021 ?Right UT, biceps, triceps, supra and infraspinatus muscle bellies ?           ?TODAY'S TREATMENT:   ?04/28/2021: ? Manual: Supine Rt GH inferior mobs in flexion, scaption and abduction g3.  Mobilization c movement into ER c posterior glide to Rt gh joint ? ? Therex: Scapular retraction hold 5 sec x 10 ?   Isometric Rt shoulder flexion, abduction, ER, IR at doorway c arm at side 10 sec hold x 5 each way ?   Green grip web 2-3 sec hold 20x ? ? Neuro re-ed: Reactive isometric holds ER/IR in 20 deg abduction supine c mild to moderate resistances 30 sec bouts ? ?04/25/2021: ? Manual: Supine Rt GH inferior mobs in flexion, scaption and abduction g3.  Mobilization c movement into ER c posterior glide to Rt gh joint ? ? Therex: Scapular retraction hold 5 sec x 10 ?   Seated Rt arm at side biceps curl green band 20x, triceps 20x (in clinic only) ?   Green grip web 2-3 sec hold 20x ? ? Neuro re-ed: Reactive isometric holds ER/IR in 20 deg abduction supine c mild to moderate resistances 30 sec bouts ? ? ?04/21/2021: ? Manual: Supine Rt GH inferior mobs in flexion, scaption and abduction g3.  Mobilization c movement into ER c posterior glide to Rt gh joint ? Therex: Isometric flexion, abduction, ER, IR 10 sec x 5 each way ? ? Vasopneumatic: Rt shoulder medium 34 degrees  ? ?04/15/2021: ? Manual: Supine Rt GH inferior mobs in flexion, scaption and abduction g3.  Mobilization c movement into ER c posterior glide to Rt gh joint ? ? Vasopneumatic: Rt shoulder medium 34 degrees  ? ?04/13/2021: ? Therex: Review of existing HEP c cues for technique adjustments to optimize.  ?Supine passive Rt shoulder flexion c Lt hand movement x 15 ?   Supine passive Rt shoulder ER in 30 deg abduction c wand 10 sec 2 x 5 ?   Seated scapular retraction 5 sec hold x 10 ?   Standing Isometric holds in doorway Rt shoulder 5 sec x 12 in flexion, abd, er, ir  ? ? Manual: Supine Rt GH inferior mobs in flexion,  scaption and abduction g3.  Mobilization c movement into ER c posterior glide to Rt gh joint ? ? ?  ?PATIENT EDUCATION: ?04/13/2021 ?Education details: HEP  progression, emphasis on passive movement in stretching ?Person educated: Patient ?Education method: Explanation, Demonstration, Tactile cues, Verbal cues, and Handouts ?Education comprehension: verbalized understanding and returned demonstration ?  ?  ?HOME EXERCISE PROGRAM: ?04/13/2021:  ?Access Code: [email or web address] ?URL: [email or web address] ?Date: 04/13/2021 ?Prepared by: Scot Jun ? ?Exercises ?Seated Scapular Retraction - 1 x daily - 7 x weekly - 1 sets - 10 reps - 2-3 sec hold ?Isometric Shoulder Flexion at Wall - 1 x daily - 7 x weekly - 1 sets - 5 reps - 10 sec hold ?Isometric Shoulder Abduction - Arm Straight at Wall (Mirrored) - 1 x daily - 7 x weekly - 1 sets - 5 reps - 10 sec hold ?Isometric Shoulder Extension at Wall (Mirrored) - 1 x daily - 7 x weekly - 1 sets - 5 reps - 10 sec hold ?Standing Isometric Shoulder Internal Rotation at Doorway - 1 x daily - 7 x weekly - 1 sets - 5 reps - 10 sec hold ?Standing Isometric Shoulder External Rotation with Doorway (Mirrored) - 1 x daily - 7 x weekly - 1 sets - 5 reps - 10 sec hold ?Supine Shoulder Flexion PROM (Mirrored) - 2-3 x daily - 7 x weekly - 1-2 sets - 10 reps - 5 hold ?Supine Shoulder External Rotation with Dowel (Mirrored) - 2-3 x daily - 7 x weekly - 1-2 sets - 5 reps - 10 hold ? ?  ?  ?ASSESSMENT: ?  ?CLINICAL IMPRESSION: ?Pt continued to show gains in symptoms and overall passive mobility as measured above.  Good progress overall noted and Pt is showing in good position when transition to AROM/strengthening allowed.  ? ? ?  ?ACTIVITY LIMITATIONS cleaning, driving, meal prep, occupation, and laundry.  ?   ?  ?REHAB POTENTIAL: Excellent ?  ?CLINICAL DECISION MAKING: Stable/uncomplicated ?  ?EVALUATION COMPLEXITY: Low ?  ?  ?GOALS: ?Goals reviewed with patient? Yes ?  ?SHORT TERM GOALS: ?  ?STG Name Target Date Goal status  ?1 Independent with initial HEP ? 04/14/21 MET ?Assessed 04/13/2021  ?  ?LONG TERM GOALS:  ?  ?LTG Name Target Date Goal  status  ?1 Independent with final HEP ? 05/12/21 On going ?Assessed 04/21/2021  ?2 FOTO score improved to 68 for improved function ?Baseline:41 05/12/21 On going ?Assessed 04/21/2021  ?3 Rt shoulder flex and ABD improved to [facility] to comple

## 2021-04-29 ENCOUNTER — Ambulatory Visit: Payer: 59 | Admitting: Orthopedic Surgery

## 2021-05-09 ENCOUNTER — Encounter: Payer: Self-pay | Admitting: Rehabilitative and Restorative Service Providers"

## 2021-05-09 ENCOUNTER — Other Ambulatory Visit: Payer: Self-pay

## 2021-05-09 ENCOUNTER — Ambulatory Visit (INDEPENDENT_AMBULATORY_CARE_PROVIDER_SITE_OTHER): Payer: 59 | Admitting: Rehabilitative and Restorative Service Providers"

## 2021-05-09 DIAGNOSIS — M6281 Muscle weakness (generalized): Secondary | ICD-10-CM

## 2021-05-09 DIAGNOSIS — R600 Localized edema: Secondary | ICD-10-CM

## 2021-05-09 DIAGNOSIS — M25511 Pain in right shoulder: Secondary | ICD-10-CM | POA: Diagnosis not present

## 2021-05-09 DIAGNOSIS — M62838 Other muscle spasm: Secondary | ICD-10-CM

## 2021-05-09 DIAGNOSIS — M25611 Stiffness of right shoulder, not elsewhere classified: Secondary | ICD-10-CM

## 2021-05-09 NOTE — Therapy (Signed)
?OUTPATIENT PHYSICAL THERAPY TREATMENT NOTE ? ? ?Patient Name: Carly Lindsey ?MRN: [id number] ?DOB:[birth year], 46 y.o., female ?Today's Date: 05/09/2021 ? ? PT End of Session - 05/09/21 1351   ? ? Visit Number 7   ? Number of Visits 12   ? Date for PT Re-Evaluation 05/12/21   ? Authorization Type Aetna NAP   ? PT Start Time 1347   ? PT Stop Time 0865   ? PT Time Calculation (min) 40 min   ? Activity Tolerance Patient tolerated treatment well   ? Behavior During Therapy [facility] for tasks assessed/performed   ? ?  ?  ? ?  ? ? ? ? ? ? ? ?History reviewed. No pertinent past medical history. ?Past Surgical History:  ?Procedure Laterality Date  ? SHOULDER ARTHROSCOPY WITH OPEN ROTATOR CUFF REPAIR AND DISTAL CLAVICLE ACROMINECTOMY Right 03/17/2021  ? Procedure: RIGHT SHOULDER ARTHROSCOPY, DEBRIDEMENT, MINI OPEN ROTATOR CUFF TENDON REPAIR, BICEPS TENODESIS;  Surgeon: Meredith Pel, MD;  Location: [facility];  Service: Orthopedics;  Laterality: Right;  ? WISDOM TOOTH EXTRACTION    ? ?Patient Active Problem List  ? Diagnosis Date Noted  ? Complete tear of right rotator cuff   ? Biceps tendonitis on right   ? Superior glenoid labrum lesion of right shoulder   ? ? ?REFERRING DIAG:  S46.011A (ICD-10-CM) - Traumatic complete tear of right rotator cuff, initial encounter  ? ?REFERRING PROVIDER: Meredith Pel, MD ? ?ONSET DATE: 11/24/21 (surgery 03/17/2021) ? ?THERAPY DIAG:  ?Stiffness of right shoulder, not elsewhere classified ? ?Acute pain of right shoulder ? ?Muscle weakness (generalized) ? ?Other muscle spasm ? ?Localized edema ? ?PERTINENT HISTORY: unremarkable ? ?PRECAUTIONS: PROM and isometrics 2x/wk for 6 wks (05/06/2021) ? ?SUBJECTIVE:  Pt indicated not feeling the best over weekend.  Did have sharp pains randomly at times.  ? ?PAIN:  ?Are you having pain? No ?NPRS scale: 3/10 ?Pain location: shoulder  ?Pain orientation: Rt ?PAIN TYPE: surgery ?Pain description: achy, sharp pains at times.  ?Aggravating factors: random   ?Relieving factors:  OTCs ? ? ? ? ?OBJECTIVE:  ?  ?PATIENT SURVEYS:  ?     03/31/2021 FOTO 41 (predicted 68) ?  ?COGNITION: ?         03/31/2021 Overall cognitive status: Within functional limits for tasks assessed ?                              ?SENSATION: ?         03/31/2021 ?Light touch: Appears intact ?         Stereognosis: Appears intact ?         Hot/Cold: Appears intact ?         Proprioception: Appears intact ?  ?POSTURE: ?03/31/2021 ?Depressed right shoulder; forward shoulders bil ?  ?UPPER EXTREMITY AROM/PROM: ?  ?A/PROM Right ?03/31/2021 Left ?03/31/2021 Right ?04/13/2021 ?All PROM in supine with pain limited end range/muscle guarding Right ?04/21/2021 ?All PROM in supine Right ?04/28/2021 ?All PROM in supine Right ?04/28/2021  ?Shoulder flexion P:103 A:148  ?P: 165 105 130 147 120 AAROM in supine  ?Shoulder extension P:30 A:40      ?Shoulder abduction P:78 A:180 78 110 115   ?Shoulder adduction          ?Shoulder internal rotation P:49 A:60 ?P:80 34 (in 30 deg abduction)  52 (in 45 deg abduction)   ?Shoulder external rotation P:20 A:90 10 (in 30 deg abduction 50 (  45 deg abduction) 52 ( 45 deg abduction)   ?Elbow flexion          ?Elbow extension          ?Wrist flexion          ?Wrist extension          ?Wrist ulnar deviation          ?Wrist radial deviation          ?Wrist pronation          ?Wrist supination          ?(Blank rows = not tested) ?  ?UPPER EXTREMITY MMT: ?  ?MMT Right ?03/31/2021 Left ?03/31/2021  ?Shoulder flexion      ?Shoulder extension      ?Shoulder abduction      ?Shoulder adduction      ?Shoulder internal rotation      ?Shoulder external rotation      ?Middle trapezius      ?Lower trapezius      ?Elbow flexion      ?Elbow extension      ?Wrist flexion      ?Wrist extension      ?Wrist ulnar deviation      ?Wrist radial deviation      ?Wrist pronation      ?Wrist supination      ?Grip strength (lbs)      ?(Blank rows = not tested) ?  ?PALPATION:  ?03/31/2021 ?Right UT, biceps, triceps, supra  and infraspinatus muscle bellies ?           ?TODAY'S TREATMENT:  ?05/09/2021: ? Manual: Supine Rt GH inferior mobs in flexion, scaption and abduction g3.  Mobilization c movement into ER c posterior glide to Rt gh joint ? ? Therex: Supine passive Rt shoulder flexion c Lt arm assist x 10 ?   Supine 1 lb wand AAROM flexion x 10 ?   Supine 1 lb wand AAROM protraction 5 sec hold x 10 ?   Seated pulley flexion 3 mins ?   Seated pulley scaption 3 mins ?   Standing green band rows 3 x 10 ? ?  ? ?04/28/2021: ? Manual: Supine Rt GH inferior mobs in flexion, scaption and abduction g3.  Mobilization c movement into ER c posterior glide to Rt gh joint ? ? Therex: Scapular retraction hold 5 sec x 10 ?   Isometric Rt shoulder flexion, abduction, ER, IR at doorway c arm at side 10 sec hold x 5 each way ?   Green grip web 2-3 sec hold 20x ? ? Neuro re-ed: Reactive isometric holds ER/IR in 20 deg abduction supine c mild to moderate resistances 30 sec bouts ? ?04/25/2021: ? Manual: Supine Rt GH inferior mobs in flexion, scaption and abduction g3.  Mobilization c movement into ER c posterior glide to Rt gh joint ? ? Therex: Scapular retraction hold 5 sec x 10 ?   Seated Rt arm at side biceps curl green band 20x, triceps 20x (in clinic only) ?   Green grip web 2-3 sec hold 20x ? ? Neuro re-ed: Reactive isometric holds ER/IR in 20 deg abduction supine c mild to moderate resistances 30 sec bouts ? ? ?04/21/2021: ? Manual: Supine Rt GH inferior mobs in flexion, scaption and abduction g3.  Mobilization c movement into ER c posterior glide to Rt gh joint ? Therex: Isometric flexion, abduction, ER, IR 10 sec x 5 each way ? ? Vasopneumatic: Rt shoulder medium 34 degrees  ? ? ?  ?  PATIENT EDUCATION: ?04/13/2021 ?Education details: HEP progression, emphasis on passive movement in stretching ?Person educated: Patient ?Education method: Explanation, Demonstration, Tactile cues, Verbal cues, and Handouts ?Education comprehension: verbalized understanding  and returned demonstration ?  ?  ?HOME EXERCISE PROGRAM: ?04/13/2021:  ?Access Code: [email or web address] ?URL: [email or web address] ?Date: 04/13/2021 ?Prepared by: Scot Jun ? ?Exercises ?Seated Scapular Retraction - 1 x daily - 7 x weekly - 1 sets - 10 reps - 2-3 sec hold ?Isometric Shoulder Flexion at Wall - 1 x daily - 7 x weekly - 1 sets - 5 reps - 10 sec hold ?Isometric Shoulder Abduction - Arm Straight at Wall (Mirrored) - 1 x daily - 7 x weekly - 1 sets - 5 reps - 10 sec hold ?Isometric Shoulder Extension at Wall (Mirrored) - 1 x daily - 7 x weekly - 1 sets - 5 reps - 10 sec hold ?Standing Isometric Shoulder Internal Rotation at Doorway - 1 x daily - 7 x weekly - 1 sets - 5 reps - 10 sec hold ?Standing Isometric Shoulder External Rotation with Doorway (Mirrored) - 1 x daily - 7 x weekly - 1 sets - 5 reps - 10 sec hold ?Supine Shoulder Flexion PROM (Mirrored) - 2-3 x daily - 7 x weekly - 1-2 sets - 10 reps - 5 hold ?Supine Shoulder External Rotation with Dowel (Mirrored) - 2-3 x daily - 7 x weekly - 1-2 sets - 5 reps - 10 hold ? ?  ?  ?ASSESSMENT: ?  ?CLINICAL IMPRESSION: ?Mild increase in resistance from muscle guarding noted in elevations attempts in manual intervention.  Introduction of a few AAROM activities in clinic today due to being past 6 week PROM/isometric timeline.  Continued skilled PT services indicated at this time to continue to progress.  ? ? ?  ?ACTIVITY LIMITATIONS cleaning, driving, meal prep, occupation, and laundry.  ?   ?  ?REHAB POTENTIAL: Excellent ?  ?CLINICAL DECISION MAKING: Stable/uncomplicated ?  ?EVALUATION COMPLEXITY: Low ?  ?  ?GOALS: ?Goals reviewed with patient? Yes ?  ?SHORT TERM GOALS: ?  ?STG Name Target Date Goal status  ?1 Independent with initial HEP ? 04/14/21 MET ?Assessed 04/13/2021  ?  ?LONG TERM GOALS:  ?  ?LTG Name Target Date Goal status  ?1 Independent with final HEP ? 05/12/21 On going ?Assessed 04/21/2021  ?2 FOTO score improved to 68 for improved  function ?Baseline:41 05/12/21 On going ?Assessed 04/21/2021  ?3 Rt shoulder flex and ABD improved to [facility] to complete [place] ADLs ? 05/12/21 On going ?Assessed 04/21/2021  ?4 Report pain < 1/10 with ADLs  ? 05/12/21 On going ?A

## 2021-05-11 ENCOUNTER — Other Ambulatory Visit: Payer: Self-pay

## 2021-05-11 ENCOUNTER — Encounter: Payer: Self-pay | Admitting: Orthopedic Surgery

## 2021-05-11 ENCOUNTER — Ambulatory Visit (INDEPENDENT_AMBULATORY_CARE_PROVIDER_SITE_OTHER): Payer: 59 | Admitting: Rehabilitative and Restorative Service Providers"

## 2021-05-11 ENCOUNTER — Encounter: Payer: Self-pay | Admitting: Rehabilitative and Restorative Service Providers"

## 2021-05-11 ENCOUNTER — Ambulatory Visit (INDEPENDENT_AMBULATORY_CARE_PROVIDER_SITE_OTHER): Payer: 59 | Admitting: Orthopedic Surgery

## 2021-05-11 DIAGNOSIS — M25611 Stiffness of right shoulder, not elsewhere classified: Secondary | ICD-10-CM | POA: Diagnosis not present

## 2021-05-11 DIAGNOSIS — M62838 Other muscle spasm: Secondary | ICD-10-CM

## 2021-05-11 DIAGNOSIS — M25511 Pain in right shoulder: Secondary | ICD-10-CM

## 2021-05-11 DIAGNOSIS — S46011A Strain of muscle(s) and tendon(s) of the rotator cuff of right shoulder, initial encounter: Secondary | ICD-10-CM

## 2021-05-11 DIAGNOSIS — R600 Localized edema: Secondary | ICD-10-CM

## 2021-05-11 DIAGNOSIS — M6281 Muscle weakness (generalized): Secondary | ICD-10-CM

## 2021-05-11 NOTE — Therapy (Signed)
?OUTPATIENT PHYSICAL THERAPY TREATMENT NOTE / Progress Note/ Recert ? ? ?Patient Name: Carly Lindsey ?MRN: [id number] ?DOB:[birth year], 46 y.o., female ?Today's Date: 05/11/2021 ? ? PT End of Session - 05/11/21 1018   ? ? Visit Number 8   ? Number of Visits 24   ? Date for PT Re-Evaluation 07/06/21   ? Authorization Type Aetna NAP   ? PT Start Time 1013   ? PT Stop Time 1054   ? PT Time Calculation (min) 41 min   ? Activity Tolerance Patient tolerated treatment well   ? Behavior During Therapy [facility] for tasks assessed/performed   ? ?  ?  ? ?  ? ?Progress Note ?Reporting Period 03/31/2021 to 05/11/2021 ? ?See note below for Objective Data and Assessment of Progress/Goals.  ? ? ? ?History reviewed. No pertinent past medical history. ?Past Surgical History:  ?Procedure Laterality Date  ? SHOULDER ARTHROSCOPY WITH OPEN ROTATOR CUFF REPAIR AND DISTAL CLAVICLE ACROMINECTOMY Right 03/17/2021  ? Procedure: RIGHT SHOULDER ARTHROSCOPY, DEBRIDEMENT, MINI OPEN ROTATOR CUFF TENDON REPAIR, BICEPS TENODESIS;  Surgeon: Cammy Copa, MD;  Location: [facility];  Service: Orthopedics;  Laterality: Right;  ? WISDOM TOOTH EXTRACTION    ? ?Patient Active Problem List  ? Diagnosis Date Noted  ? Complete tear of right rotator cuff   ? Biceps tendonitis on right   ? Superior glenoid labrum lesion of right shoulder   ? ? ?REFERRING DIAG:  S46.011A (ICD-10-CM) - Traumatic complete tear of right rotator cuff, initial encounter  ? ?REFERRING PROVIDER: Cammy Copa, MD ? ?ONSET DATE: 11/24/21 (surgery 03/17/2021) ? ?THERAPY DIAG:  ?Stiffness of right shoulder, not elsewhere classified ? ?Acute pain of right shoulder ? ?Muscle weakness (generalized) ? ?Other muscle spasm ? ?Localized edema ? ?PERTINENT HISTORY: unremarkable ? ?PRECAUTIONS: PROM and isometrics 2x/wk for 6 wks (05/06/2021) ? ?SUBJECTIVE:  Pt indicated 2/10 today upon arrival, just a little sore.  Not as before.  ? ?PAIN:  ?Are you having pain? Yes ?NPRS scale: 2/10 ?Pain location:  shoulder  ?Pain orientation: Rt ?PAIN TYPE: surgery ?Pain description: achy, sharp pains at times.  ?Aggravating factors: random  ?Relieving factors:  OTCs ? ? ? ? ?OBJECTIVE:  ?  ?PATIENT SURVEYS:  ?05/11/2021: 40       ? ?03/31/2021 FOTO 41 (predicted 68) ?  ?COGNITION: ?         03/31/2021 Overall cognitive status: Within functional limits for tasks assessed ?                              ?SENSATION: ?         03/31/2021 ?Light touch: Appears intact ?         Stereognosis: Appears intact ?         Hot/Cold: Appears intact ?         Proprioception: Appears intact ?  ?POSTURE: ?03/31/2021 ?Depressed right shoulder; forward shoulders bil ?  ?UPPER EXTREMITY AROM/PROM: ?  ?A/PROM Right ?03/31/2021 Left ?03/31/2021 Right ?04/13/2021 ?All PROM in supine with pain limited end range/muscle guarding Right ?04/21/2021 ?All PROM in supine Right ?04/28/2021 ?All PROM in supine Right ?04/28/2021 Right ?05/11/2021 ?In supine  ?Shoulder flexion P:103 A:148  ?P: 165 105 130 147 120 AAROM in supine AROM 132 ?PROM 140  ?Shoulder extension P:30 A:40       ?Shoulder abduction P:78 A:180 78 110 115  AROM 100 ?PROM 110  ?Shoulder adduction           ?  Shoulder internal rotation P:49 A:60 ?P:80 34 (in 30 deg abduction)  52 (in 45 deg abduction)  AROM 60 ?PROM 65 ?in 45 deg abduction  ?Shoulder external rotation P:20 A:90 10 (in 30 deg abduction 50 ( 45 deg abduction) 52 ( 45 deg abduction)  AROM 55 ?PROM 58 ?in 45 deg abduction  ?Elbow flexion           ?Elbow extension           ?Wrist flexion           ?Wrist extension           ?Wrist ulnar deviation           ?Wrist radial deviation           ?Wrist pronation           ?Wrist supination           ?(Blank rows = not tested) ?  ?UPPER EXTREMITY MMT: ?  ?MMT Right ?03/31/2021 Left ?03/31/2021  ?Shoulder flexion      ?Shoulder extension      ?Shoulder abduction      ?Shoulder adduction      ?Shoulder internal rotation      ?Shoulder external rotation      ?Middle trapezius      ?Lower trapezius       ?Elbow flexion      ?Elbow extension      ?Wrist flexion      ?Wrist extension      ?Wrist ulnar deviation      ?Wrist radial deviation      ?Wrist pronation      ?Wrist supination      ?Grip strength (lbs)      ?(Blank rows = not tested) ?  ?PALPATION:  ?03/31/2021 ?Right UT, biceps, triceps, supra and infraspinatus muscle bellies ?           ?TODAY'S TREATMENT:  ?05/09/2021: ? Manual: Supine Rt GH inferior mobs in flexion, scaption and abduction g3.  Mobilization c movement into ER c posterior glide to Rt gh joint ? ? Therex: Supine 1 lb wand AAROM flexion x 10 ?   Standing wand AAROM abduction 2 x 10  ?   Standing green band rows 2 x 15 ?   Standing green gh ext 2 x 15 ?  ? ?04/28/2021: ? Manual: Supine Rt GH inferior mobs in flexion, scaption and abduction g3.  Mobilization c movement into ER c posterior glide to Rt gh joint ? ? Therex: Scapular retraction hold 5 sec x 10 ?   Isometric Rt shoulder flexion, abduction, ER, IR at doorway c arm at side 10 sec hold x 5 each way ?   Green grip web 2-3 sec hold 20x ? ? Neuro re-ed: Reactive isometric holds ER/IR in 20 deg abduction supine c mild to moderate resistances 30 sec bouts ? ?04/25/2021: ? Manual: Supine Rt GH inferior mobs in flexion, scaption and abduction g3.  Mobilization c movement into ER c posterior glide to Rt gh joint ? ? Therex: Scapular retraction hold 5 sec x 10 ?   Seated Rt arm at side biceps curl green band 20x, triceps 20x (in clinic only) ?   Green grip web 2-3 sec hold 20x ? ? Neuro re-ed: Reactive isometric holds ER/IR in 20 deg abduction supine c mild to moderate resistances 30 sec bouts ? ? ?04/21/2021: ? Manual: Supine Rt GH inferior mobs in flexion, scaption and abduction g3.  Mobilization c  movement into ER c posterior glide to Rt gh joint ? Therex: Isometric flexion, abduction, ER, IR 10 sec x 5 each way ? ? Vasopneumatic: Rt shoulder medium 34 degrees  ? ? ?  ?PATIENT EDUCATION: ?05/11/2021 ?Education details: HEP progression, emphasis on  passive movement in stretching ?Person educated: Patient ?Education method: Explanation, Demonstration, Tactile cues, Verbal cues, and Handouts ?Education comprehension: verbalized understanding and returned demonstration ?  ?  ?HOME EXERCISE PROGRAM: ?05/11/2021:  ?Access Code: [email or web address] ?URL: [email or web address] ?Date: 05/11/2021 ?Prepared by: Chyrel MassonMichael Aneisa Karren ? ?Exercises ?- Seated Scapular Retraction  - 1 x daily - 7 x weekly - 1 sets - 10 reps - 2-3 sec hold ?- Isometric Shoulder Flexion at Wall  - 1-2 x daily - 7 x weekly - 1 sets - 5 reps - 10 sec hold ?- Isometric Shoulder Abduction - Arm Straight at Wall (Mirrored)  - 1-2 x daily - 7 x weekly - 1 sets - 5 reps - 10 sec  hold ?- Isometric Shoulder Extension at Wall (Mirrored)  - 1-2 x daily - 7 x weekly - 1 sets - 5 reps - 10 sec hold ?- Standing Isometric Shoulder Internal Rotation at Doorway  - 1-2 x daily - 7 x weekly - 1 sets - 5 reps - 10 sec hold ?- Standing Isometric Shoulder External Rotation with Doorway (Mirrored)  - 1-2 x daily - 7 x weekly - 1 sets - 5 reps - 10 sec  hold ?- Supine Shoulder External Rotation with Dowel (Mirrored)  - 2-3 x daily - 7 x weekly - 1-2 sets - 5 reps - 10 hold ?- Supine Shoulder Flexion Extension AAROM with Dowel  - 2-3 x daily - 7 x weekly - 1-2 sets - 10 reps - 5 hold ?- Standing Shoulder Abduction AAROM with Dowel (Mirrored)  - 2-3 x daily - 7 x weekly - 1-2 sets - 10 reps ? ?  ?  ?ASSESSMENT: ?  ?CLINICAL IMPRESSION: ?Pt has attended 8 visits overall during course of treatment with primarily passive range of motion focus per protocol to this point.  See objective data for updated information.  Inclusion of active range assessment today in supine added today.  Pt has demonstrated improvements since evaluation but still had pain c mobility/strength deficits that require continued skilled PT services at this time.  ? ? ?  ?ACTIVITY LIMITATIONS cleaning, driving, meal prep, occupation, and laundry.  ?   ?   ?REHAB POTENTIAL: Excellent ?  ?CLINICAL DECISION MAKING: Stable/uncomplicated ?  ?EVALUATION COMPLEXITY: Low ?  ?  ?GOALS: ?Goals reviewed with patient? Yes ?  ?SHORT TERM GOALS: ?  ?STG Name Target Date G

## 2021-05-13 ENCOUNTER — Encounter: Payer: Self-pay | Admitting: Orthopedic Surgery

## 2021-05-13 NOTE — Progress Notes (Signed)
? ?  Post-Op Visit Note ?  ?Patient: Carly Lindsey           ?Date of Birth: 1975-03-16           ?MRN: [id number] ?Visit Date: 05/11/2021 ?PCP: Patient, No Pcp Per (Inactive) ? ? ?Assessment & Plan: ? ?Chief Complaint:  ?Chief Complaint  ?Patient presents with  ? Right Shoulder - Routine Post Op  ?  right shoulder arthroscopy with mini open rotator cuff tear repair and biceps tenodesis on 03/17/2021  ? ?Visit Diagnoses:  ?1. Traumatic complete tear of right rotator cuff, initial encounter   ? ? ?Plan: Carly Lindsey is a 46 year old patient underwent right shoulder arthroscopy with debridement mini open rotator cuff tear repair and biceps tenodesis on 03/18/2021.  7 weeks out.  Started strengthening recently.  Physical therapy 2 times a week.  Taking over-the-counter ibuprofen as needed for pain.  States her range of motion is gradually improving.  On exam she has smooth passive range of motion to palpation.  Rotator cuff strength is improving.  Passive range of motion also improving but not quite compatible with the left shoulder.  Plan is to continue therapy and recheck range of motion in 6 weeks.  Okay for strengthening but I do want her to avoid any type of lifting out away from her body. ? ?Follow-Up Instructions: Return in about 6 weeks (around 06/22/2021).  ? ?Orders:  ?No orders of the defined types were placed in this encounter. ? ?No orders of the defined types were placed in this encounter. ? ? ?Imaging: ?No results found. ? ?PMFS History: ?Patient Active Problem List  ? Diagnosis Date Noted  ? Complete tear of right rotator cuff   ? Biceps tendonitis on right   ? Superior glenoid labrum lesion of right shoulder   ? ?History reviewed. No pertinent past medical history.  ?History reviewed. No pertinent family history.  ?Past Surgical History:  ?Procedure Laterality Date  ? SHOULDER ARTHROSCOPY WITH OPEN ROTATOR CUFF REPAIR AND DISTAL CLAVICLE ACROMINECTOMY Right 03/17/2021  ? Procedure: RIGHT SHOULDER ARTHROSCOPY,  DEBRIDEMENT, MINI OPEN ROTATOR CUFF TENDON REPAIR, BICEPS TENODESIS;  Surgeon: Cammy Copa, MD;  Location: [facility];  Service: Orthopedics;  Laterality: Right;  ? WISDOM TOOTH EXTRACTION    ? ?Social History  ? ?Occupational History  ? Not on file  ?Tobacco Use  ? Smoking status: Never  ? Smokeless tobacco: Not on file  ?Substance and Sexual Activity  ? Alcohol use: Yes  ?  Comment: Occasional  ? Drug use: No  ? Sexual activity: Yes  ?  Birth control/protection: None  ? ? ? ?

## 2021-05-16 ENCOUNTER — Ambulatory Visit (INDEPENDENT_AMBULATORY_CARE_PROVIDER_SITE_OTHER): Payer: 59 | Admitting: Rehabilitative and Restorative Service Providers"

## 2021-05-16 ENCOUNTER — Encounter: Payer: Self-pay | Admitting: Rehabilitative and Restorative Service Providers"

## 2021-05-16 ENCOUNTER — Other Ambulatory Visit: Payer: Self-pay

## 2021-05-16 DIAGNOSIS — M6281 Muscle weakness (generalized): Secondary | ICD-10-CM

## 2021-05-16 DIAGNOSIS — M25511 Pain in right shoulder: Secondary | ICD-10-CM

## 2021-05-16 DIAGNOSIS — M62838 Other muscle spasm: Secondary | ICD-10-CM

## 2021-05-16 DIAGNOSIS — R600 Localized edema: Secondary | ICD-10-CM

## 2021-05-16 DIAGNOSIS — M25611 Stiffness of right shoulder, not elsewhere classified: Secondary | ICD-10-CM | POA: Diagnosis not present

## 2021-05-16 NOTE — Therapy (Signed)
?OUTPATIENT PHYSICAL THERAPY TREATMENT NOTE  ? ? ?Patient Name: Carly Lindsey ?MRN: [id number] ?DOB:07/25/1975, 46 y.o., female ?Today's Date: 05/16/2021 ? ? PT End of Session - 05/16/21 1005   ? ? Visit Number 9   ? Number of Visits 24   ? Date for PT Re-Evaluation 07/06/21   ? Authorization Type [employer] NAP   ? PT Start Time 1007   ? PT Stop Time 1046   ? PT Time Calculation (min) 39 min   ? Activity Tolerance Patient tolerated treatment well   ? Behavior During Therapy WFL for tasks assessed/performed   ? ?  ?  ? ?  ? ? ? ?History reviewed. No pertinent past medical history. ?Past Surgical History:  ?Procedure Laterality Date  ? SHOULDER ARTHROSCOPY WITH OPEN ROTATOR CUFF REPAIR AND DISTAL CLAVICLE ACROMINECTOMY Right 03/17/2021  ? Procedure: RIGHT SHOULDER ARTHROSCOPY, DEBRIDEMENT, MINI OPEN ROTATOR CUFF TENDON REPAIR, BICEPS TENODESIS;  Surgeon: Dean, Gregory Scott, MD;  Location: [facility];  Service: Orthopedics;  Laterality: Right;  ? WISDOM TOOTH EXTRACTION    ? ?Patient Active Problem List  ? Diagnosis Date Noted  ? Complete tear of right rotator cuff   ? Biceps tendonitis on right   ? Superior glenoid labrum lesion of right shoulder   ? ? ?REFERRING DIAG:  S46.011A (ICD-10-CM) - Traumatic complete tear of right rotator cuff, initial encounter  ? ?REFERRING PROVIDER: Dean, Gregory Scott, MD ? ?ONSET DATE: 11/24/21 (surgery 03/17/2021) ? ?THERAPY DIAG:  ?Stiffness of right shoulder, not elsewhere classified ? ?Acute pain of right shoulder ? ?Muscle weakness (generalized) ? ?Other muscle spasm ? ?Localized edema ? ?PERTINENT HISTORY: unremarkable ? ?PRECAUTIONS: Update 05/16/2021:  strengthening allowed but "no lifting out away from body" per MD note on 05/11/2021 ? ?SUBJECTIVE:  Pt indicated no pain upon arrival today.  Pt indicate sling is all gone.  ? ?PAIN:  ?Are you having pain? No ?NPRS scale: 0/10 ?Pain location: shoulder  ?Pain orientation: Rt ?PAIN TYPE: surgery ?Pain description:  ?Aggravating factors:   ?Relieving factors:   ? ? ? ? ?OBJECTIVE:  ?  ?PATIENT SURVEYS:  ?05/11/2021: 40       ? ?03/31/2021 FOTO 41 (predicted 68) ?  ?COGNITION: ?         03/31/2021 Overall cognitive status: Within functional limits for tasks assessed ?                              ?SENSATION: ?         03/31/2021 ?Light touch: Appears intact ?         Stereognosis: Appears intact ?         Hot/Cold: Appears intact ?         Proprioception: Appears intact ?  ?POSTURE: ?03/31/2021 ?Depressed right shoulder; forward shoulders bil ?  ?UPPER EXTREMITY AROM/PROM: ?  ?A/PROM Right ?03/31/2021 Left ?03/31/2021 Right ?04/13/2021 ?All PROM in supine with pain limited end range/muscle guarding Right ?04/21/2021 ?All PROM in supine Right ?04/28/2021 ?All PROM in supine Right ?04/28/2021 Right ?05/11/2021 ?In supine  ?Shoulder flexion P:103 A:148  ?P: 165 105 130 147 120 AAROM in supine AROM 132 ?PROM 140  ?Shoulder extension P:30 A:40       ?Shoulder abduction P:78 A:180 78 110 115  AROM 100 ?PROM 110  ?Shoulder adduction           ?Shoulder internal rotation P:49 A:60 ?P:80 34 (in 30 deg abduction)  52 (in   45 deg abduction)  AROM 60 ?PROM 65 ?in 45 deg abduction  ?Shoulder external rotation P:20 A:90 10 (in 30 deg abduction 50 ( 45 deg abduction) 52 ( 45 deg abduction)  AROM 55 ?PROM 58 ?in 45 deg abduction  ?Elbow flexion           ?Elbow extension           ?Wrist flexion           ?Wrist extension           ?Wrist ulnar deviation           ?Wrist radial deviation           ?Wrist pronation           ?Wrist supination           ?(Blank rows = not tested) ?  ?UPPER EXTREMITY MMT: ?  ?MMT Right ?03/31/2021 Left ?03/31/2021  ?Shoulder flexion      ?Shoulder extension      ?Shoulder abduction      ?Shoulder adduction      ?Shoulder internal rotation      ?Shoulder external rotation      ?Middle trapezius      ?Lower trapezius      ?Elbow flexion      ?Elbow extension      ?Wrist flexion      ?Wrist extension      ?Wrist ulnar deviation      ?Wrist radial deviation       ?Wrist pronation      ?Wrist supination      ?Grip strength (lbs)      ?(Blank rows = not tested) ?  ?PALPATION:  ?03/31/2021 ?Right UT, biceps, triceps, supra and infraspinatus muscle bellies ?           ?TODAY'S TREATMENT:  ?05/16/2021: ? Manual: Supine Rt GH inferior mobs in flexion, scaption and abduction g3.  Mobilization c movement into ER c posterior glide to Rt gh joint ? ? Therex: Pulley flexion, scaption 3 mins each direction ?   UBE fwd/reverse 3 mins each way lvl 2.0 ?Supine 1 lb wand AAROM flexion x 10 ?Supine AROM flexion 2 x 10 bilateral ?Sidelying Rt shoulder abduction 2 x 10  ?   Sidelying Rt shoulder ER AROM 2 x 10 c towel at side ?   Standing green band rows x20 ?   Standing green gh ext x20 ? ?05/09/2021: ? Manual: Supine Rt GH inferior mobs in flexion, scaption and abduction g3.  Mobilization c movement into ER c posterior glide to Rt gh joint ? ? Therex: Supine 1 lb wand AAROM flexion x 10 ?   Standing wand AAROM abduction 2 x 10  ?   Standing green band rows 2 x 15 ?   Standing green gh ext 2 x 15 ?  ? ?04/28/2021: ? Manual: Supine Rt GH inferior mobs in flexion, scaption and abduction g3.  Mobilization c movement into ER c posterior glide to Rt gh joint ? ? Therex: Scapular retraction hold 5 sec x 10 ?   Isometric Rt shoulder flexion, abduction, ER, IR at doorway c arm at side 10 sec hold x 5 each way ?   Green grip web 2-3 sec hold 20x ? ? Neuro re-ed: Reactive isometric holds ER/IR in 20 deg abduction supine c mild to moderate resistances 30 sec bouts ? ?04/25/2021: ? Manual: Supine Rt GH inferior mobs in flexion, scaption and abduction g3.  Mobilization  c movement into ER c posterior glide to Rt gh joint ? ? Therex: Scapular retraction hold 5 sec x 10 ?   Seated Rt arm at side biceps curl green band 20x, triceps 20x (in clinic only) ?   Green grip web 2-3 sec hold 20x ? ? Neuro re-ed: Reactive isometric holds ER/IR in 20 deg abduction supine c mild to moderate resistances 30 sec  bouts ? ? ? ?  ?PATIENT EDUCATION: ?05/16/2021 ?Education details: HEP progression, emphasis on passive movement in stretching ?Person educated: Patient ?Education method: Explanation, Demonstration, Tactile cues, Verbal cues, and Handouts ?Education comprehension: verbalized understanding and returned demonstration ?  ?  ?HOME EXERCISE PROGRAM: ?05/16/2021:  ?Access Code: [place] ?URL: [email or web address] ?Date: 05/16/2021 ?Prepared by: Scot Jun ? ?Exercises ?- Seated Scapular Retraction  - 1 x daily - 7 x weekly - 1 sets - 10 reps - 2-3 sec hold ?- Standing Isometric Shoulder External Rotation with Doorway (Mirrored)  - 1-2 x daily - 7 x weekly - 1 sets - 5 reps - 10 sec  hold ?- Supine Shoulder External Rotation with Dowel (Mirrored)  - 2-3 x daily - 7 x weekly - 1-2 sets - 5 reps - 10 hold ?- Supine Shoulder Flexion Extension AAROM with Dowel  - 2-3 x daily - 7 x weekly - 1-2 sets - 10 reps - 5 hold ?- Standing Shoulder Abduction AAROM with Dowel (Mirrored)  - 2-3 x daily - 7 x weekly - 1-2 sets - 10 reps ?- Supine Shoulder Flexion Extension Full Range AROM  - 1-2 x daily - 7 x weekly - 1-2 sets - 10 reps ?- Sidelying Shoulder Abduction Palm Forward  - 1-2 x daily - 7 x weekly - 1-2 sets - 10 reps ?- Sidelying Shoulder External Rotation (Mirrored)  - 1-2 x daily - 7 x weekly - 1-2 sets - 10 reps ? ?  ?  ?ASSESSMENT: ?  ?CLINICAL IMPRESSION: ?Started active movement strengthening c good response while in clinic.  Addition to HEP provided today.  Continued mobility gains to benefit overall movement as well.  Continued skilled PT services indicated at this time.  ? ? ?  ?ACTIVITY LIMITATIONS cleaning, driving, meal prep, occupation, and laundry.  ?   ?  ?REHAB POTENTIAL: Excellent ?  ?CLINICAL DECISION MAKING: Stable/uncomplicated ?  ?EVALUATION COMPLEXITY: Low ?  ?  ?GOALS: ?Goals reviewed with patient? Yes ?  ?SHORT TERM GOALS: ?  ?STG Name Target Date Goal status  ?1 Independent with initial  HEP ? 04/14/21 MET ?Assessed 04/13/2021  ?  ?LONG TERM GOALS:  ?  ?LTG Name Target Date Goal status  ?1 Independent with final HEP ? 07/06/2021 On going ?Assessed 05/11/2021  ?2 FOTO score improved to 68 for improved function ?

## 2021-05-18 ENCOUNTER — Encounter: Payer: Self-pay | Admitting: Rehabilitative and Restorative Service Providers"

## 2021-05-18 ENCOUNTER — Ambulatory Visit (INDEPENDENT_AMBULATORY_CARE_PROVIDER_SITE_OTHER): Payer: 59 | Admitting: Rehabilitative and Restorative Service Providers"

## 2021-05-18 ENCOUNTER — Other Ambulatory Visit: Payer: Self-pay

## 2021-05-18 DIAGNOSIS — M62838 Other muscle spasm: Secondary | ICD-10-CM | POA: Diagnosis not present

## 2021-05-18 DIAGNOSIS — M25611 Stiffness of right shoulder, not elsewhere classified: Secondary | ICD-10-CM

## 2021-05-18 DIAGNOSIS — M25511 Pain in right shoulder: Secondary | ICD-10-CM

## 2021-05-18 DIAGNOSIS — M6281 Muscle weakness (generalized): Secondary | ICD-10-CM | POA: Diagnosis not present

## 2021-05-18 DIAGNOSIS — R600 Localized edema: Secondary | ICD-10-CM

## 2021-05-18 NOTE — Therapy (Signed)
?OUTPATIENT PHYSICAL THERAPY TREATMENT NOTE  ? ? ?Patient Name: Carly Lindsey ?MRN: [id number] ?DOB:1976-01-31, 46 y.o., female ?Today's Date: 05/18/2021 ? ? PT End of Session - 05/18/21 1012   ? ? Visit Number 10   ? Number of Visits 24   ? Date for PT Re-Evaluation 07/06/21   ? Authorization Type [employer] NAP   ? PT Start Time 1012   ? PT Stop Time 2595   ? PT Time Calculation (min) 41 min   ? Activity Tolerance Patient tolerated treatment well   ? Behavior During Therapy [facility] for tasks assessed/performed   ? ?  ?  ? ?  ? ? ? ? ?History reviewed. No pertinent past medical history. ?Past Surgical History:  ?Procedure Laterality Date  ? SHOULDER ARTHROSCOPY WITH OPEN ROTATOR CUFF REPAIR AND DISTAL CLAVICLE ACROMINECTOMY Right 03/17/2021  ? Procedure: RIGHT SHOULDER ARTHROSCOPY, DEBRIDEMENT, MINI OPEN ROTATOR CUFF TENDON REPAIR, BICEPS TENODESIS;  Surgeon: Meredith Pel, MD;  Location: [facility];  Service: Orthopedics;  Laterality: Right;  ? WISDOM TOOTH EXTRACTION    ? ?Patient Active Problem List  ? Diagnosis Date Noted  ? Complete tear of right rotator cuff   ? Biceps tendonitis on right   ? Superior glenoid labrum lesion of right shoulder   ? ? ?REFERRING DIAG:  S46.011A (ICD-10-CM) - Traumatic complete tear of right rotator cuff, initial encounter  ? ?REFERRING PROVIDER: Meredith Pel, MD ? ?ONSET DATE: 11/24/21 (surgery 03/17/2021) ? ?THERAPY DIAG:  ?Stiffness of right shoulder, not elsewhere classified ? ?Acute pain of right shoulder ? ?Muscle weakness (generalized) ? ?Other muscle spasm ? ?Localized edema ? ?PERTINENT HISTORY: unremarkable ? ?PRECAUTIONS: Update 05/16/2021:  strengthening allowed but "no lifting out away from body" per MD note on 05/11/2021 ? ?SUBJECTIVE:  Pt indicated some general soreness today, noting "maybe slept on it wrong."  Pt. Indicated no sharp pains to report.  ? ?PAIN:  ?Are you having pain? No (general soreness indicated) ?NPRS scale: 0/10 ?Pain location: shoulder  ?Pain orientation:  Rt ?PAIN TYPE: surgery ?Pain description:  ?Aggravating factors: nothing specific ?Relieving factors:   ? ? ? ? ?OBJECTIVE:  ?  ?PATIENT SURVEYS:  ?05/11/2021: 40       ? ?03/31/2021 FOTO 41 (predicted 68) ?  ?COGNITION: ?         03/31/2021 Overall cognitive status: Within functional limits for tasks assessed ?                              ?SENSATION: ?         03/31/2021 ?Light touch: Appears intact ?         Stereognosis: Appears intact ?         Hot/Cold: Appears intact ?         Proprioception: Appears intact ?  ?POSTURE: ?03/31/2021 ?Depressed right shoulder; forward shoulders bil ?  ?UPPER EXTREMITY AROM/PROM: ?  ?A/PROM Right ?03/31/2021 Left ?03/31/2021 Right ?04/13/2021 ?All PROM in supine with pain limited end range/muscle guarding Right ?04/21/2021 ?All PROM in supine Right ?04/28/2021 ?All PROM in supine Right ?04/28/2021 Right ?05/11/2021 ?In supine  ?Shoulder flexion P:103 A:148  ?P: 165 105 130 147 120 AAROM in supine AROM 132 ?PROM 140  ?Shoulder extension P:30 A:40       ?Shoulder abduction P:78 A:180 78 110 115  AROM 100 ?PROM 110  ?Shoulder adduction           ?Shoulder internal rotation  P:49 A:60 ?P:80 34 (in 30 deg abduction)  52 (in 45 deg abduction)  AROM 60 ?PROM 65 ?in 45 deg abduction  ?Shoulder external rotation P:20 A:90 10 (in 30 deg abduction 50 ( 45 deg abduction) 52 ( 45 deg abduction)  AROM 55 ?PROM 58 ?in 45 deg abduction  ?Elbow flexion           ?Elbow extension           ?Wrist flexion           ?Wrist extension           ?Wrist ulnar deviation           ?Wrist radial deviation           ?Wrist pronation           ?Wrist supination           ?(Blank rows = not tested) ?  ?UPPER EXTREMITY MMT: ?  ?MMT Right ?03/31/2021 Left ?03/31/2021  ?Shoulder flexion      ?Shoulder extension      ?Shoulder abduction      ?Shoulder adduction      ?Shoulder internal rotation      ?Shoulder external rotation      ?Middle trapezius      ?Lower trapezius      ?Elbow flexion      ?Elbow extension      ?Wrist  flexion      ?Wrist extension      ?Wrist ulnar deviation      ?Wrist radial deviation      ?Wrist pronation      ?Wrist supination      ?Grip strength (lbs)      ?(Blank rows = not tested) ?  ?PALPATION:  ?03/31/2021 ?Right UT, biceps, triceps, supra and infraspinatus muscle bellies ?           ?TODAY'S TREATMENT:  ?05/18/2021: ? Manual: Supine Rt GH inferior mobs in flexion, scaption and abduction g3.  Mobilization c movement into ER c posterior glide to Rt gh joint ? ? Neuro Re-ed ?   100 deg flexion supine stabilizations to mild resistance 30 sec bouts x 5 ? ? Therex: Pulley flexion, scaption 3 mins each direction ?   UBE fwd/reverse 3 mins each way lvl 2.0 ?Supine AROM flexion x30 bilateral ?Supine horizontal abduction green band 2 x10 ?Sidelying Rt shoulder abduction 2 x 15 ?   Sidelying Rt flexion x 10 ?   Standing green band ER walk outs 5 sec hold x 15 ?    ? ?05/16/2021: ? Manual: Supine Rt GH inferior mobs in flexion, scaption and abduction g3.  Mobilization c movement into ER c posterior glide to Rt gh joint ? ? Therex: Pulley flexion, scaption 3 mins each direction ?   UBE fwd/reverse 3 mins each way lvl 2.0 ?Supine 1 lb wand AAROM flexion x 10 ?Supine AROM flexion 2 x 10 bilateral ?Sidelying Rt shoulder abduction 2 x 10  ?   Sidelying Rt shoulder ER AROM 2 x 10 c towel at side ?   Standing green band rows x20 ?   Standing green gh ext x20 ? ?05/09/2021: ? Manual: Supine Rt GH inferior mobs in flexion, scaption and abduction g3.  Mobilization c movement into ER c posterior glide to Rt gh joint ? ? Therex: Supine 1 lb wand AAROM flexion x 10 ?   Standing wand AAROM abduction 2 x 10  ?   Standing green band  rows 2 x 15 ?   Standing green gh ext 2 x 15 ?  ? ?04/28/2021: ? Manual: Supine Rt GH inferior mobs in flexion, scaption and abduction g3.  Mobilization c movement into ER c posterior glide to Rt gh joint ? ? Therex: Scapular retraction hold 5 sec x 10 ?   Isometric Rt shoulder flexion, abduction, ER, IR at  doorway c arm at side 10 sec hold x 5 each way ?   Green grip web 2-3 sec hold 20x ? ? Neuro re-ed: Reactive isometric holds ER/IR in 20 deg abduction supine c mild to moderate resistances 30 sec bouts ? ?  ?PATIENT EDUCATION: ?05/16/2021 ?Education details: HEP progression, emphasis on passive movement in stretching ?Person educated: Patient ?Education method: Explanation, Demonstration, Tactile cues, Verbal cues, and Handouts ?Education comprehension: verbalized understanding and returned demonstration ?  ?  ?HOME EXERCISE PROGRAM: ?05/16/2021:  ?Access Code: [place] ?URL: [email or web address] ?Date: 05/16/2021 ?Prepared by: Scot Jun ? ?Exercises ?- Seated Scapular Retraction  - 1 x daily - 7 x weekly - 1 sets - 10 reps - 2-3 sec hold ?- Standing Isometric Shoulder External Rotation with Doorway (Mirrored)  - 1-2 x daily - 7 x weekly - 1 sets - 5 reps - 10 sec  hold ?- Supine Shoulder External Rotation with Dowel (Mirrored)  - 2-3 x daily - 7 x weekly - 1-2 sets - 5 reps - 10 hold ?- Supine Shoulder Flexion Extension AAROM with Dowel  - 2-3 x daily - 7 x weekly - 1-2 sets - 10 reps - 5 hold ?- Standing Shoulder Abduction AAROM with Dowel (Mirrored)  - 2-3 x daily - 7 x weekly - 1-2 sets - 10 reps ?- Supine Shoulder Flexion Extension Full Range AROM  - 1-2 x daily - 7 x weekly - 1-2 sets - 10 reps ?- Sidelying Shoulder Abduction Palm Forward  - 1-2 x daily - 7 x weekly - 1-2 sets - 10 reps ?- Sidelying Shoulder External Rotation (Mirrored)  - 1-2 x daily - 7 x weekly - 1-2 sets - 10 reps ? ?  ?  ?ASSESSMENT: ?  ?CLINICAL IMPRESSION: ?Difficulty noted in reaction to external perturbations due to weakness.  Pt to benefit from progressive strengthening program (no lifting at this time).   ?  ?ACTIVITY LIMITATIONS cleaning, driving, meal prep, occupation, and laundry.  ?   ?  ?REHAB POTENTIAL: Excellent ?  ?CLINICAL DECISION MAKING: Stable/uncomplicated ?  ?EVALUATION COMPLEXITY: Low ?  ?   ?GOALS: ?Goals reviewed with patient? Yes ?  ?SHORT TERM GOALS: ?  ?STG Name Target Date Goal status  ?1 Independent with initial HEP ? 04/14/21 MET ?Assessed 04/13/2021  ?  ?LONG TERM GOALS:  ?  ?LTG Name Target Date Go

## 2021-05-26 ENCOUNTER — Encounter: Payer: 59 | Admitting: Rehabilitative and Restorative Service Providers"

## 2021-05-27 ENCOUNTER — Encounter: Payer: 59 | Admitting: Rehabilitative and Restorative Service Providers"

## 2021-05-31 ENCOUNTER — Encounter: Payer: Self-pay | Admitting: Rehabilitative and Restorative Service Providers"

## 2021-05-31 ENCOUNTER — Ambulatory Visit (INDEPENDENT_AMBULATORY_CARE_PROVIDER_SITE_OTHER): Payer: 59 | Admitting: Rehabilitative and Restorative Service Providers"

## 2021-05-31 DIAGNOSIS — M6281 Muscle weakness (generalized): Secondary | ICD-10-CM

## 2021-05-31 DIAGNOSIS — M25511 Pain in right shoulder: Secondary | ICD-10-CM

## 2021-05-31 DIAGNOSIS — M62838 Other muscle spasm: Secondary | ICD-10-CM | POA: Diagnosis not present

## 2021-05-31 DIAGNOSIS — M25611 Stiffness of right shoulder, not elsewhere classified: Secondary | ICD-10-CM

## 2021-05-31 DIAGNOSIS — R600 Localized edema: Secondary | ICD-10-CM

## 2021-05-31 NOTE — Therapy (Signed)
?OUTPATIENT PHYSICAL THERAPY TREATMENT NOTE  ? ? ?Patient Name: Carly Lindsey ?MRN: [id number] ?DOB:17-Oct-1975, 46 y.o., female ?Today's Date: 05/31/2021 ? ? PT End of Session - 05/31/21 1020   ? ? Visit Number 11   ? Number of Visits 24   ? Date for PT Re-Evaluation 07/06/21   ? Authorization Type [employer] 30% 20 visit limit calendar year   ? Authorization - Visit Number 11   ? Authorization - Number of Visits 20   ? PT Start Time 1016   ? PT Stop Time 1056   ? PT Time Calculation (min) 40 min   ? Activity Tolerance Patient tolerated treatment well   ? Behavior During Therapy [facility] for tasks assessed/performed   ? ?  ?  ? ?  ? ? ? ? ? ?History reviewed. No pertinent past medical history. ?Past Surgical History:  ?Procedure Laterality Date  ? SHOULDER ARTHROSCOPY WITH OPEN ROTATOR CUFF REPAIR AND DISTAL CLAVICLE ACROMINECTOMY Right 03/17/2021  ? Procedure: RIGHT SHOULDER ARTHROSCOPY, DEBRIDEMENT, MINI OPEN ROTATOR CUFF TENDON REPAIR, BICEPS TENODESIS;  Surgeon: Cammy Copa, MD;  Location: [facility] OR;  Service: Orthopedics;  Laterality: Right;  ? WISDOM TOOTH EXTRACTION    ? ?Patient Active Problem List  ? Diagnosis Date Noted  ? Complete tear of right rotator cuff   ? Biceps tendonitis on right   ? Superior glenoid labrum lesion of right shoulder   ? ? ?REFERRING DIAG:  S46.011A (ICD-10-CM) - Traumatic complete tear of right rotator cuff, initial encounter  ? ?REFERRING PROVIDER: Cammy Copa, MD ? ?ONSET DATE: 11/24/21 (surgery 03/17/2021) ? ?THERAPY DIAG:  ?Stiffness of right shoulder, not elsewhere classified ? ?Acute pain of right shoulder ? ?Muscle weakness (generalized) ? ?Other muscle spasm ? ?Localized edema ? ?PERTINENT HISTORY: unremarkable ? ?PRECAUTIONS: Update 05/16/2021:  strengthening allowed but "no lifting out away from body" per MD note on 05/11/2021 ? ?SUBJECTIVE:  Pt indicated no pain upon arrival, some ache at times.  New exercises were ok.  ? ?PAIN:  ?Are you having pain? No (general soreness  indicated) ?NPRS scale: 0/10 ?Pain location: shoulder  ?Pain orientation: Rt ?PAIN TYPE: surgery ?Pain description:  ?Aggravating factors: nothing specific ?Relieving factors:   ? ? ? ? ?OBJECTIVE:  ?  ?PATIENT SURVEYS:  ?05/11/2021: 40       ? ?03/31/2021 FOTO 41 (predicted 68) ?  ?COGNITION: ?         03/31/2021 Overall cognitive status: Within functional limits for tasks assessed ?                              ?SENSATION: ?         03/31/2021 ?Light touch: Appears intact ?         Stereognosis: Appears intact ?         Hot/Cold: Appears intact ?         Proprioception: Appears intact ?  ?POSTURE: ?03/31/2021 ?Depressed right shoulder; forward shoulders bil ?  ?UPPER EXTREMITY AROM/PROM: ?  ?A/PROM Right ?03/31/2021 Left ?03/31/2021 Right ?04/13/2021 ?All PROM in supine with pain limited end range/muscle guarding Right ?04/21/2021 ?All PROM in supine Right ?04/28/2021 ?All PROM in supine Right ?04/28/2021 Right ?05/11/2021 ?In supine Right ?05/31/2021  ?Shoulder flexion P:103 A:148  ?P: 165 105 130 147 120 AAROM in supine AROM 132 ?PROM 140 Standing AROM 0-90 degrees on Rt prior to shrug  ?Shoulder extension P:30 A:40        ?  Shoulder abduction P:78 A:180 78 110 115  AROM 100 ?PROM 110   ?Shoulder adduction            ?Shoulder internal rotation P:49 A:60 ?P:80 34 (in 30 deg abduction)  52 (in 45 deg abduction)  AROM 60 ?PROM 65 ?in 45 deg abduction   ?Shoulder external rotation P:20 A:90 10 (in 30 deg abduction 50 ( 45 deg abduction) 52 ( 45 deg abduction)  AROM 55 ?PROM 58 ?in 45 deg abduction   ?Elbow flexion            ?Elbow extension            ?Wrist flexion            ?Wrist extension            ?Wrist ulnar deviation            ?Wrist radial deviation            ?Wrist pronation            ?Wrist supination            ?(Blank rows = not tested) ?  ?UPPER EXTREMITY MMT: ?  ?MMT Right ?03/31/2021 Left ?03/31/2021  ?Shoulder flexion      ?Shoulder extension      ?Shoulder abduction      ?Shoulder adduction      ?Shoulder  internal rotation      ?Shoulder external rotation      ?Middle trapezius      ?Lower trapezius      ?Elbow flexion      ?Elbow extension      ?Wrist flexion      ?Wrist extension      ?Wrist ulnar deviation      ?Wrist radial deviation      ?Wrist pronation      ?Wrist supination      ?Grip strength (lbs)      ?(Blank rows = not tested) ?  ?PALPATION:  ?03/31/2021 ?Right UT, biceps, triceps, supra and infraspinatus muscle bellies ?           ?TODAY'S TREATMENT:  ?05/31/2021: ? Manual: Supine Rt GH inferior mobs in flexion, scaption and abduction g3.   ? ? Neuro Re-ed ?   100 deg flexion supine stabilizations to mild resistance 30 sec bouts x 5 ? ? Therex: Pulley flexion, scaption 3 mins each direction ?   Supine AROM flexion x20 bilateral ?Supine horizontal abduction green band 2 x10 ?Sidelying Rt shoulder abduction 2 x 15 ?   Standing green band Rt shoulder ER c towel at side 2 x 10  ?   Standing shoulder flexion 0-90 degrees x 10 ?    ? ? ?05/18/2021: ? Manual: Supine Rt GH inferior mobs in flexion, scaption and abduction g3.  Mobilization c movement into ER c posterior glide to Rt gh joint ? ? Neuro Re-ed ?   100 deg flexion supine stabilizations to mild resistance 30 sec bouts x 5 ? ? Therex: Pulley flexion, scaption 3 mins each direction ?   UBE fwd/reverse 3 mins each way lvl 2.0 ?Supine AROM flexion x30 bilateral ?Supine horizontal abduction green band 2 x10 ?Sidelying Rt shoulder abduction 2 x 15 ?   Sidelying Rt flexion x 10 ?   Standing green band ER walk outs 5 sec hold x 15 ?    ? ?05/16/2021: ? Manual: Supine Rt GH inferior mobs in flexion, scaption and abduction g3.  Mobilization c  movement into ER c posterior glide to Rt gh joint ? ? Therex: Pulley flexion, scaption 3 mins each direction ?   UBE fwd/reverse 3 mins each way lvl 2.0 ?Supine 1 lb wand AAROM flexion x 10 ?Supine AROM flexion 2 x 10 bilateral ?Sidelying Rt shoulder abduction 2 x 10  ?   Sidelying Rt shoulder ER AROM 2 x 10 c towel at  side ?   Standing green band rows x20 ?   Standing green gh ext x20 ? ?05/09/2021: ? Manual: Supine Rt GH inferior mobs in flexion, scaption and abduction g3.  Mobilization c movement into ER c posterior glide to Rt gh joint ? ? Therex: Supine 1 lb wand AAROM flexion x 10 ?   Standing wand AAROM abduction 2 x 10  ?   Standing green band rows 2 x 15 ?   Standing green gh ext 2 x 15 ?  ?PATIENT EDUCATION: ?05/16/2021 ?Education details: HEP progression, emphasis on passive movement in stretching ?Person educated: Patient ?Education method: Explanation, Demonstration, Tactile cues, Verbal cues, and Handouts ?Education comprehension: verbalized understanding and returned demonstration ?  ?  ?HOME EXERCISE PROGRAM: ?05/16/2021:  ?Access Code: [email or web address] ?URL: [email or web address] ?Date: 05/16/2021 ?Prepared by: Chyrel MassonMichael Kingstin Heims ? ?Exercises ?- Seated Scapular Retraction  - 1 x daily - 7 x weekly - 1 sets - 10 reps - 2-3 sec hold ?- Standing Isometric Shoulder External Rotation with Doorway (Mirrored)  - 1-2 x daily - 7 x weekly - 1 sets - 5 reps - 10 sec  hold ?- Supine Shoulder External Rotation with Dowel (Mirrored)  - 2-3 x daily - 7 x weekly - 1-2 sets - 5 reps - 10 hold ?- Supine Shoulder Flexion Extension AAROM with Dowel  - 2-3 x daily - 7 x weekly - 1-2 sets - 10 reps - 5 hold ?- Standing Shoulder Abduction AAROM with Dowel (Mirrored)  - 2-3 x daily - 7 x weekly - 1-2 sets - 10 reps ?- Supine Shoulder Flexion Extension Full Range AROM  - 1-2 x daily - 7 x weekly - 1-2 sets - 10 reps ?- Sidelying Shoulder Abduction Palm Forward  - 1-2 x daily - 7 x weekly - 1-2 sets - 10 reps ?- Sidelying Shoulder External Rotation (Mirrored)  - 1-2 x daily - 7 x weekly - 1-2 sets - 10 reps ? ?  ?  ?ASSESSMENT: ?  ?CLINICAL IMPRESSION: ?Able to perform elevation against gravity to shoulder height. Plan to continue active movement/strengthening to improve functional reach capacity.  Of note, insurance plan stated 20 visit  limit per calendar year (today visit 11).  May adjust frequency to 1x/week to accommodate.  ?  ?ACTIVITY LIMITATIONS cleaning, driving, meal prep, occupation, and laundry.  ?   ?  ?REHAB POTENTIAL: Excellent ?  ?[place]

## 2021-06-02 ENCOUNTER — Encounter: Payer: 59 | Admitting: Rehabilitative and Restorative Service Providers"

## 2021-06-07 ENCOUNTER — Encounter: Payer: 59 | Admitting: Rehabilitative and Restorative Service Providers"

## 2021-06-09 ENCOUNTER — Encounter: Payer: 59 | Admitting: Rehabilitative and Restorative Service Providers"

## 2021-06-17 ENCOUNTER — Ambulatory Visit (INDEPENDENT_AMBULATORY_CARE_PROVIDER_SITE_OTHER): Payer: 59 | Admitting: Physical Therapy

## 2021-06-17 ENCOUNTER — Encounter: Payer: Self-pay | Admitting: Physical Therapy

## 2021-06-17 DIAGNOSIS — M6281 Muscle weakness (generalized): Secondary | ICD-10-CM | POA: Diagnosis not present

## 2021-06-17 DIAGNOSIS — R600 Localized edema: Secondary | ICD-10-CM

## 2021-06-17 DIAGNOSIS — M25511 Pain in right shoulder: Secondary | ICD-10-CM

## 2021-06-17 DIAGNOSIS — M25611 Stiffness of right shoulder, not elsewhere classified: Secondary | ICD-10-CM | POA: Diagnosis not present

## 2021-06-17 DIAGNOSIS — M62838 Other muscle spasm: Secondary | ICD-10-CM

## 2021-06-17 NOTE — Therapy (Signed)
?OUTPATIENT PHYSICAL THERAPY TREATMENT NOTE  ? ? ?Patient Name: Carly Lindsey ?MRN: [id number] ?DOB:27-Aug-1975, 46 y.o., female ?Today's Date: 06/17/2021 ? ? PT End of Session - 06/17/21 0932   ? ? Visit Number 12   ? Number of Visits 24   ? Date for PT Re-Evaluation 07/06/21   ? Authorization Type [employer] 30% 20 visit limit calendar year   ? Authorization - Visit Number 12   ? Authorization - Number of Visits 20   ? PT Start Time 0930   ? PT Stop Time 1009   ? PT Time Calculation (min) 39 min   ? Activity Tolerance Patient tolerated treatment well   ? Behavior During Therapy [facility] for tasks assessed/performed   ? ?  ?  ? ?  ? ? ? ? ? ? ?History reviewed. No pertinent past medical history. ?Past Surgical History:  ?Procedure Laterality Date  ? SHOULDER ARTHROSCOPY WITH OPEN ROTATOR CUFF REPAIR AND DISTAL CLAVICLE ACROMINECTOMY Right 03/17/2021  ? Procedure: RIGHT SHOULDER ARTHROSCOPY, DEBRIDEMENT, MINI OPEN ROTATOR CUFF TENDON REPAIR, BICEPS TENODESIS;  Surgeon: Meredith Pel, MD;  Location: [facility];  Service: Orthopedics;  Laterality: Right;  ? WISDOM TOOTH EXTRACTION    ? ?Patient Active Problem List  ? Diagnosis Date Noted  ? Complete tear of right rotator cuff   ? Biceps tendonitis on right   ? Superior glenoid labrum lesion of right shoulder   ? ? ?REFERRING DIAG:  S46.011A (ICD-10-CM) - Traumatic complete tear of right rotator cuff, initial encounter  ? ?REFERRING PROVIDER: Meredith Pel, MD ? ?ONSET DATE: 11/24/21 (surgery 03/17/2021) ? ?THERAPY DIAG:  ?Stiffness of right shoulder, not elsewhere classified ? ?Acute pain of right shoulder ? ?Muscle weakness (generalized) ? ?Other muscle spasm ? ?Localized edema ? ?PERTINENT HISTORY: unremarkable ? ?PRECAUTIONS: Update 05/16/2021:  strengthening allowed but "no lifting out away from body" per MD note on 05/11/2021 ? ?SUBJECTIVE:  doing well, no complaints  ? ?PAIN:  ?Are you having pain? No (general soreness indicated) ?NPRS scale: 0/10 ?Pain location: shoulder   ?Pain orientation: Rt ?PAIN TYPE: surgery ?Pain description:  ?Aggravating factors: nothing specific ?Relieving factors:   ? ? ? ? ?OBJECTIVE:  ?  ?PATIENT SURVEYS:  ?05/11/2021: 40       ? ?03/31/2021 FOTO 41 (predicted 68) ?  ?COGNITION: ?         03/31/2021 Overall cognitive status: Within functional limits for tasks assessed ?                              ?SENSATION: ?         03/31/2021 ?Light touch: Appears intact ?         Stereognosis: Appears intact ?         Hot/Cold: Appears intact ?         Proprioception: Appears intact ?  ?POSTURE: ?03/31/2021 ?Depressed right shoulder; forward shoulders bil ?  ?UPPER EXTREMITY AROM/PROM: ?  ?A/PROM Right ?03/31/2021 Left ?03/31/2021 Right ?04/13/2021 ?All PROM in supine with pain limited end range/muscle guarding Right ?04/21/2021 ?All PROM in supine Right ?04/28/2021 ?All PROM in supine Right ?04/28/2021 Right ?05/11/2021 ?In supine Right ?05/31/2021 Right ?06/17/21  ?Shoulder flexion P:103 A:148  ?P: 165 105 130 147 120 AAROM in supine AROM 132 ?PROM 140 Standing AROM 0-90 degrees on Rt prior to shrug Supine 142 ?Standing 114 before shrug  ?Shoulder extension P:30 A:40         ?  Shoulder abduction P:78 A:180 78 110 115  AROM 100 ?PROM 110  A supine 105 ?P supine 127  ?Shoulder adduction             ?Shoulder internal rotation P:49 A:60 ?P:80 34 (in 30 deg abduction)  52 (in 45 deg abduction)  AROM 60 ?PROM 65 ?in 45 deg abduction    ?Shoulder external rotation P:20 A:90 10 (in 30 deg abduction 50 ( 45 deg abduction) 52 ( 45 deg abduction)  AROM 55 ?PROM 58 ?in 45 deg abduction  A 55 ?P 58 ?In 45 deg abdct  ?(Blank rows = not tested) ?  ?UPPER EXTREMITY MMT: ?  ?MMT Right ?03/31/2021 Left ?03/31/2021  ?Shoulder flexion      ?Shoulder extension      ?Shoulder abduction      ?Shoulder adduction      ?Shoulder internal rotation      ?Shoulder external rotation      ?Middle trapezius      ?Lower trapezius      ?Elbow flexion      ?Elbow extension      ?Wrist flexion      ?Wrist extension       ?Wrist ulnar deviation      ?Wrist radial deviation      ?Wrist pronation      ?Wrist supination      ?Grip strength (lbs)      ?(Blank rows = not tested) ?  ?PALPATION:  ?03/31/2021 ?Right UT, biceps, triceps, supra and infraspinatus muscle bellies ?           ?TODAY'S TREATMENT:  ?06/17/21 ?Therex: ?     Sitting: ?Pulleys flexion and scaption x 3 min each ?    Sidelying: ?Abduction with 5 sec hold at end range 2x10 ?ER 2x10 with 5 sec hold ?    Standing: ?Rows 2x10; L3 band ? ?Manual: ?PROM Rt shoulder all motions to tolerance; G2-3 Inf and A/P mobs ? ? ?05/31/2021: ? Manual: Supine Rt GH inferior mobs in flexion, scaption and abduction g3.   ? ? Neuro Re-ed ?   100 deg flexion supine stabilizations to mild resistance 30 sec bouts x 5 ? ? Therex: Pulley flexion, scaption 3 mins each direction ?   Supine AROM flexion x20 bilateral ?Supine horizontal abduction green band 2 x10 ?Sidelying Rt shoulder abduction 2 x 15 ?   Standing green band Rt shoulder ER c towel at side 2 x 10  ?   Standing shoulder flexion 0-90 degrees x 10 ?    ? ? ?05/18/2021: ? Manual: Supine Rt GH inferior mobs in flexion, scaption and abduction g3.  Mobilization c movement into ER c posterior glide to Rt gh joint ? ? Neuro Re-ed ?   100 deg flexion supine stabilizations to mild resistance 30 sec bouts x 5 ? ? Therex: Pulley flexion, scaption 3 mins each direction ?   UBE fwd/reverse 3 mins each way lvl 2.0 ?Supine AROM flexion x30 bilateral ?Supine horizontal abduction green band 2 x10 ?Sidelying Rt shoulder abduction 2 x 15 ?   Sidelying Rt flexion x 10 ?   Standing green band ER walk outs 5 sec hold x 15 ?    ? ?05/16/2021: ? Manual: Supine Rt GH inferior mobs in flexion, scaption and abduction g3.  Mobilization c movement into ER c posterior glide to Rt gh joint ? ? Therex: Pulley flexion, scaption 3 mins each direction ?   UBE fwd/reverse 3 mins each way  lvl 2.0 ?Supine 1 lb wand AAROM flexion x 10 ?Supine AROM flexion 2 x 10  bilateral ?Sidelying Rt shoulder abduction 2 x 10  ?   Sidelying Rt shoulder ER AROM 2 x 10 c towel at side ?   Standing green band rows x20 ?   Standing green gh ext x20 ? ?PATIENT EDUCATION: ?05/16/2021 ?Education details: HEP progression, emphasis on passive movement in stretching ?Person educated: Patient ?Education method: Explanation, Demonstration, Tactile cues, Verbal cues, and Handouts ?Education comprehension: verbalized understanding and returned demonstration ?  ?  ?HOME EXERCISE PROGRAM: ?05/16/2021:  ?Access Code: [email or web address] ?URL: [email or web address] ?Date: 05/16/2021 ?Prepared by: Scot Jun ? ?Exercises ?- Seated Scapular Retraction  - 1 x daily - 7 x weekly - 1 sets - 10 reps - 2-3 sec hold ?- Standing Isometric Shoulder External Rotation with Doorway (Mirrored)  - 1-2 x daily - 7 x weekly - 1 sets - 5 reps - 10 sec  hold ?- Supine Shoulder External Rotation with Dowel (Mirrored)  - 2-3 x daily - 7 x weekly - 1-2 sets - 5 reps - 10 hold ?- Supine Shoulder Flexion Extension AAROM with Dowel  - 2-3 x daily - 7 x weekly - 1-2 sets - 10 reps - 5 hold ?- Standing Shoulder Abduction AAROM with Dowel (Mirrored)  - 2-3 x daily - 7 x weekly - 1-2 sets - 10 reps ?- Supine Shoulder Flexion Extension Full Range AROM  - 1-2 x daily - 7 x weekly - 1-2 sets - 10 reps ?- Sidelying Shoulder Abduction Palm Forward  - 1-2 x daily - 7 x weekly - 1-2 sets - 10 reps ?- Sidelying Shoulder External Rotation (Mirrored)  - 1-2 x daily - 7 x weekly - 1-2 sets - 10 reps ? ?  ?  ?ASSESSMENT: ?  ?CLINICAL IMPRESSION: ?Pt tolerated session well today with improvements in AROM noted today.  Progressing well with PT, and anticipate will need to continue 1x/wk x 4-6 more weeks to maximize function and strength. ?  ?ACTIVITY LIMITATIONS cleaning, driving, meal prep, occupation, and laundry.  ?   ?  ?REHAB POTENTIAL: Excellent ?  ?CLINICAL DECISION MAKING: Stable/uncomplicated ?  ?EVALUATION COMPLEXITY: Low ?  ?   ?GOALS: ?Goals reviewed with patient? Yes ?  ?SHORT TERM GOALS: ?  ?STG Name Target Date Goal status  ?1 Independent with initial HEP ? 04/14/21 MET ?Assessed 04/13/2021  ?  ?LONG TERM GOALS:  ?  ?LTG Name Target Date Goal status

## 2021-06-22 ENCOUNTER — Ambulatory Visit (INDEPENDENT_AMBULATORY_CARE_PROVIDER_SITE_OTHER): Payer: 59 | Admitting: Orthopedic Surgery

## 2021-06-22 DIAGNOSIS — S46011A Strain of muscle(s) and tendon(s) of the rotator cuff of right shoulder, initial encounter: Secondary | ICD-10-CM

## 2021-06-23 ENCOUNTER — Encounter: Payer: Self-pay | Admitting: Rehabilitative and Restorative Service Providers"

## 2021-06-23 ENCOUNTER — Ambulatory Visit (INDEPENDENT_AMBULATORY_CARE_PROVIDER_SITE_OTHER): Payer: 59 | Admitting: Rehabilitative and Restorative Service Providers"

## 2021-06-23 ENCOUNTER — Encounter: Payer: Self-pay | Admitting: Orthopedic Surgery

## 2021-06-23 DIAGNOSIS — M25511 Pain in right shoulder: Secondary | ICD-10-CM | POA: Diagnosis not present

## 2021-06-23 DIAGNOSIS — M25611 Stiffness of right shoulder, not elsewhere classified: Secondary | ICD-10-CM

## 2021-06-23 DIAGNOSIS — M62838 Other muscle spasm: Secondary | ICD-10-CM

## 2021-06-23 DIAGNOSIS — M6281 Muscle weakness (generalized): Secondary | ICD-10-CM

## 2021-06-23 DIAGNOSIS — R600 Localized edema: Secondary | ICD-10-CM

## 2021-06-23 NOTE — Therapy (Signed)
?OUTPATIENT PHYSICAL THERAPY TREATMENT NOTE  ? ? ?Patient Name: Carly Lindsey ?MRN: [id number] ?DOB:[birth year], 46 y.o., female ?Today's Date: 06/23/2021 ? ? PT End of Session - 06/23/21 1003   ? ? Visit Number 13   ? Number of Visits 24   ? Date for PT Re-Evaluation 07/06/21   ? Authorization Type [employer] 30% 20 visit limit calendar year   ? Authorization - Visit Number 13   ? Authorization - Number of Visits 20   ? PT Start Time 1010   ? PT Stop Time 1104   ? PT Time Calculation (min) 54 min   ? Activity Tolerance Patient tolerated treatment well   ? Behavior During Therapy [facility] for tasks assessed/performed   ? ?  ?  ? ?  ? ? ? ? ? ? ? ?History reviewed. No pertinent past medical history. ?Past Surgical History:  ?Procedure Laterality Date  ? SHOULDER ARTHROSCOPY WITH OPEN ROTATOR CUFF REPAIR AND DISTAL CLAVICLE ACROMINECTOMY Right 03/17/2021  ? Procedure: RIGHT SHOULDER ARTHROSCOPY, DEBRIDEMENT, MINI OPEN ROTATOR CUFF TENDON REPAIR, BICEPS TENODESIS;  Surgeon: Meredith Pel, MD;  Location: [place];  Service: Orthopedics;  Laterality: Right;  ? WISDOM TOOTH EXTRACTION    ? ?Patient Active Problem List  ? Diagnosis Date Noted  ? Complete tear of right rotator cuff   ? Biceps tendonitis on right   ? Superior glenoid labrum lesion of right shoulder   ? ? ?REFERRING DIAG:  S46.011A (ICD-10-CM) - Traumatic complete tear of right rotator cuff, initial encounter  ? ?REFERRING PROVIDER: Meredith Pel, MD ? ?ONSET DATE: 11/24/21 (surgery 03/17/2021) ? ?THERAPY DIAG:  ?Stiffness of right shoulder, not elsewhere classified ? ?Acute pain of right shoulder ? ?Muscle weakness (generalized) ? ?Other muscle spasm ? ?Localized edema ? ?PERTINENT HISTORY: unremarkable ? ?PRECAUTIONS: Update 05/16/2021:  strengthening allowed but "no lifting out away from body" per MD note on 05/11/2021 ? ?SUBJECTIVE:  Pt indicated more stiffness than pain.  ? ?PAIN:  ?Are you having pain? No ?NPRS scale: 0/10 ?Pain location: shoulder  ?Pain  orientation: Rt ?PAIN TYPE: surgery ?Pain description:  ?Aggravating factors: nothing specific ?Relieving factors:   ? ? ? ? ?OBJECTIVE:  ?  ?PATIENT SURVEYS:  ?05/11/2021: 40       ? ?03/31/2021 FOTO 41 (predicted 68) ?  ?COGNITION: ?         03/31/2021 Overall cognitive status: Within functional limits for tasks assessed ?                              ?SENSATION: ?         03/31/2021 ?Light touch: Appears intact ?         Stereognosis: Appears intact ?         Hot/Cold: Appears intact ?         Proprioception: Appears intact ?  ?POSTURE: ?03/31/2021 ?Depressed right shoulder; forward shoulders bil ?  ?UPPER EXTREMITY AROM/PROM: ?  ?A/PROM Right ?03/31/2021 Left ?03/31/2021 Right ?04/13/2021 ?All PROM in supine with pain limited end range/muscle guarding Right ?04/21/2021 ?All PROM in supine Right ?04/28/2021 ?All PROM in supine Right ?04/28/2021 Right ?05/11/2021 ?In supine Right ?05/31/2021 Right ?06/17/21  ?Shoulder flexion P:103 A:148  ?P: 165 105 130 147 120 AAROM in supine AROM 132 ?PROM 140 Standing AROM 0-90 degrees on Rt prior to shrug Supine 142 ?Standing 114 before shrug  ?Shoulder extension P:30 A:40         ?  Shoulder abduction P:78 A:180 78 110 115  AROM 100 ?PROM 110  A supine 105 ?P supine 127  ?Shoulder adduction             ?Shoulder internal rotation P:49 A:60 ?P:80 34 (in 30 deg abduction)  52 (in 45 deg abduction)  AROM 60 ?PROM 65 ?in 45 deg abduction    ?Shoulder external rotation P:20 A:90 10 (in 30 deg abduction 50 ( 45 deg abduction) 52 ( 45 deg abduction)  AROM 55 ?PROM 58 ?in 45 deg abduction  A 55 ?P 58 ?In 45 deg abdct  ?(Blank rows = not tested) ?  ?UPPER EXTREMITY MMT: ?  ?MMT Right ?03/31/2021 Left ?03/31/2021  ?Shoulder flexion      ?Shoulder extension      ?Shoulder abduction      ?Shoulder adduction      ?Shoulder internal rotation      ?Shoulder external rotation      ?Middle trapezius      ?Lower trapezius      ?Elbow flexion      ?Elbow extension      ?Wrist flexion      ?Wrist extension       ?Wrist ulnar deviation      ?Wrist radial deviation      ?Wrist pronation      ?Wrist supination      ?Grip strength (lbs)      ?(Blank rows = not tested) ?  ?PALPATION:  ?03/31/2021 ?Right UT, biceps, triceps, supra and infraspinatus muscle bellies ?           ?TODAY'S TREATMENT:  ?06/23/2021: ? Manual: Supine Rt GH inferior mobs in flexion, scaption and abduction g3.  Mobilization c movement ER sustained posterior glide Rt shoulder ? ? Therex: UBE fwd/back 3 mins each way lvl 3.0 ?   Supine AROM flexion 1 lb 2 x 10 ?   Supine 90 deg flexion shoulder circles 2 lb 20x cw, ccw ?Supine horizontal abduction green band 2 x10 ?Sidelying Rt shoulder abduction 1 lb 2 x 10 ?Sidelying Rt shoulder ER c towel at side 3 x 10 c fatigue hold on last rep 1 lbs ?Doorway ER stretch in approx. 70 deg abduction 30 sec x 3 Rt arm ?Standing green band rows, gh ext 2 x 15 bilateral ?   Standing shoulder flexion 0-90 degrees review x 3 (cues to avoid shrug, watch in mirror) ? ?06/17/21 ?Therex: ?     Sitting: ?Pulleys flexion and scaption x 3 min each ?    Sidelying: ?Abduction with 5 sec hold at end range 2x10 ?ER 2x10 with 5 sec hold ?    Standing: ?Rows 2x10; L3 band ? ?Manual: ?PROM Rt shoulder all motions to tolerance; G2-3 Inf and A/P mobs ? ? ?05/31/2021: ? Manual: Supine Rt GH inferior mobs in flexion, scaption and abduction g3.   ? ? Neuro Re-ed ?   100 deg flexion supine stabilizations to mild resistance 30 sec bouts x 5 ? ? Therex: Pulley flexion, scaption 3 mins each direction ?   Supine AROM flexion x20 bilateral ?Supine horizontal abduction green band 2 x10 ?Sidelying Rt shoulder abduction 2 x 15 ?   Standing green band Rt shoulder ER c towel at side 2 x 10  ?   Standing shoulder flexion 0-90 degrees x 10 ?    ? ? ?05/18/2021: ? Manual: Supine Rt GH inferior mobs in flexion, scaption and abduction g3.  Mobilization c movement into ER c posterior  glide to Rt gh joint ? ? Neuro Re-ed ?   100 deg flexion supine stabilizations to mild  resistance 30 sec bouts x 5 ? ? Therex: Pulley flexion, scaption 3 mins each direction ?   UBE fwd/reverse 3 mins each way lvl 2.0 ?Supine AROM flexion x30 bilateral ?Supine horizontal abduction green band 2 x10 ?Sidelying Rt shoulder abduction 2 x 15 ?   Sidelying Rt flexion x 10 ?   Standing green band ER walk outs 5 sec hold x 15 ?    ? ?PATIENT EDUCATION: ?06/23/2021 ?Education details: HEP progression ?Person educated: Patient ?Education method: Explanation, Demonstration, Verbal cues, and Handouts ?Education comprehension: verbalized understanding and returned demonstration ?  ?  ?HOME EXERCISE PROGRAM: ?06/23/2021:  ?Access Code: [place] ?URL: [email or web address] ?Date: 06/23/2021 ?Prepared by: Scot Jun ? ?Exercises ?- Standing Isometric Shoulder External Rotation with Doorway (Mirrored)  - 1-2 x daily - 7 x weekly - 1 sets - 5 reps - 10 sec  hold ?- Supine Shoulder Flexion Extension AAROM with Dowel  - 2-3 x daily - 7 x weekly - 1-2 sets - 10 reps - 5 hold ?- Standing Shoulder Abduction AAROM with Dowel (Mirrored)  - 2-3 x daily - 7 x weekly - 1-2 sets - 10 reps ?- Supine Shoulder Flexion Extension Full Range AROM  - 1-2 x daily - 7 x weekly - 1-2 sets - 10 reps ?- Sidelying Shoulder Abduction Palm Forward  - 1-2 x daily - 7 x weekly - 1-2 sets - 10 reps ?- Sidelying Shoulder External Rotation (Mirrored)  - 1-2 x daily - 7 x weekly - 1-2 sets - 10 reps ?- Single Arm Doorway Pec Stretch at 90 Degrees Abduction  - 2-3 x daily - 7 x weekly - 1 sets - 3-5 reps - 30 hold ?- Standing Shoulder Flexion to 90 Degrees with Dumbbells  - 1-2 x daily - 7 x weekly - 1-2 sets - 10 reps ?- Standing Shoulder Row with Anchored Resistance  - 1-2 x daily - 7 x weekly - 3 sets - 10-15 reps ?- Shoulder Extension with Resistance  - 1-2 x daily - 7 x weekly - 3 sets - 10-15 reps ? ?  ?  ?ASSESSMENT: ?  ?CLINICAL IMPRESSION: ?Review of HEP today with adjustments to continue to promote improved range and strength  in range.  Strengthening in gravity reduced continued at this time due to shrug presence in elevation against gravity.  Additional education given on progression of weight resistance/reps to continue progressi

## 2021-06-23 NOTE — Progress Notes (Signed)
? ?  Post-Op Visit Note ?  ?Patient: Carly Lindsey           ?Date of Birth: Jun 23, 1975           ?MRN: [id number] ?Visit Date: 06/22/2021 ?PCP: Patient, No Pcp Per (Inactive) ? ? ?Assessment & Plan: ? ?Chief Complaint:  ?Chief Complaint  ?Patient presents with  ? Right Shoulder - Routine Post Op  ?   03/17/21 (59m 8d)Right Shoulder Arthroscopy, Debridement, Mini Open Rotator Cuff Tendon Repair, Biceps Tenodesis  ? ?  ? ?Visit Diagnoses:  ?1. Traumatic complete tear of right rotator cuff, initial encounter   ? ? ?Plan: Harshika is a 46 year old patient who underwent right shoulder arthroscopy with mini open rotator cuff tear repair and biceps tenodesis 03/17/2021.  Is sore at times.  Still going to therapy 1 time a week.  On examination she has range of motion on the right of 65/85/140.  4 out of 5 infraspinatus strength.  I would like her to continue physical therapy 1 time a week for at least 4 more weeks possibly 8 weeks with focusing on range of motion and strengthening.  Follow-up in 8 weeks for final recheck. ? ?Follow-Up Instructions: Return in about 8 weeks (around 08/17/2021).  ? ?Orders:  ?No orders of the defined types were placed in this encounter. ? ?No orders of the defined types were placed in this encounter. ? ? ?Imaging: ?No results found. ? ?PMFS History: ?Patient Active Problem List  ? Diagnosis Date Noted  ? Complete tear of right rotator cuff   ? Biceps tendonitis on right   ? Superior glenoid labrum lesion of right shoulder   ? ?History reviewed. No pertinent past medical history.  ?History reviewed. No pertinent family history.  ?Past Surgical History:  ?Procedure Laterality Date  ? SHOULDER ARTHROSCOPY WITH OPEN ROTATOR CUFF REPAIR AND DISTAL CLAVICLE ACROMINECTOMY Right 03/17/2021  ? Procedure: RIGHT SHOULDER ARTHROSCOPY, DEBRIDEMENT, MINI OPEN ROTATOR CUFF TENDON REPAIR, BICEPS TENODESIS;  Surgeon: Cammy Copa, MD;  Location: [facility];  Service: Orthopedics;  Laterality: Right;  ? WISDOM  TOOTH EXTRACTION    ? ?Social History  ? ?Occupational History  ? Not on file  ?Tobacco Use  ? Smoking status: Never  ? Smokeless tobacco: Not on file  ?Substance and Sexual Activity  ? Alcohol use: Yes  ?  Comment: Occasional  ? Drug use: No  ? Sexual activity: Yes  ?  Birth control/protection: None  ? ? ? ?

## 2021-06-28 ENCOUNTER — Encounter: Payer: 59 | Admitting: Rehabilitative and Restorative Service Providers"

## 2021-07-05 ENCOUNTER — Encounter: Payer: Self-pay | Admitting: Physical Therapy

## 2021-07-05 ENCOUNTER — Other Ambulatory Visit: Payer: Self-pay

## 2021-07-05 ENCOUNTER — Ambulatory Visit (INDEPENDENT_AMBULATORY_CARE_PROVIDER_SITE_OTHER): Payer: 59 | Admitting: Physical Therapy

## 2021-07-05 DIAGNOSIS — M62838 Other muscle spasm: Secondary | ICD-10-CM

## 2021-07-05 DIAGNOSIS — M25611 Stiffness of right shoulder, not elsewhere classified: Secondary | ICD-10-CM | POA: Diagnosis not present

## 2021-07-05 DIAGNOSIS — M25511 Pain in right shoulder: Secondary | ICD-10-CM | POA: Diagnosis not present

## 2021-07-05 DIAGNOSIS — M6281 Muscle weakness (generalized): Secondary | ICD-10-CM | POA: Diagnosis not present

## 2021-07-05 DIAGNOSIS — R600 Localized edema: Secondary | ICD-10-CM

## 2021-07-05 NOTE — Therapy (Signed)
OUTPATIENT PHYSICAL THERAPY TREATMENT NOTE    Patient Name: Carly Lindsey MRN: [id number] DOB:Oct 16, 1975, 46 y.o., female Today's Date: 07/05/2021   PT End of Session - 07/05/21 0931     Visit Number 14    Number of Visits 24    Date for PT Re-Evaluation 07/06/21    Authorization Type [employer] 30% 20 visit limit calendar year    Authorization - Visit Number 14    Authorization - Number of Visits 20    PT Start Time [phone]    PT Stop Time 1012    PT Time Calculation (min) 41 min    Activity Tolerance Patient tolerated treatment well    Behavior During Therapy [facility] for tasks assessed/performed                   History reviewed. No pertinent past medical history. Past Surgical History:  Procedure Laterality Date   SHOULDER ARTHROSCOPY WITH OPEN ROTATOR CUFF REPAIR AND DISTAL CLAVICLE ACROMINECTOMY Right 03/17/2021   Procedure: RIGHT SHOULDER ARTHROSCOPY, DEBRIDEMENT, MINI OPEN ROTATOR CUFF TENDON REPAIR, BICEPS TENODESIS;  Surgeon: Cammy Copa, MD;  Location: [facility];  Service: Orthopedics;  Laterality: Right;   WISDOM TOOTH EXTRACTION     Patient Active Problem List   Diagnosis Date Noted   Complete tear of right rotator cuff    Biceps tendonitis on right    Superior glenoid labrum lesion of right shoulder     REFERRING DIAG:  S46.011A (ICD-10-CM) - Traumatic complete tear of right rotator cuff, initial encounter   REFERRING PROVIDER: Cammy Copa, MD  ONSET DATE: 11/24/21 (surgery 03/17/2021)  THERAPY DIAG:  Stiffness of right shoulder, not elsewhere classified  Acute pain of right shoulder  Muscle weakness (generalized)  Other muscle spasm  Localized edema  PERTINENT HISTORY: unremarkable  PRECAUTIONS: Update 05/16/2021:  strengthening allowed but "no lifting out away from body" per MD note on 05/11/2021  SUBJECTIVE:  Shoulder is more stiff than painful, random periods of "pain" but nothing significant   PAIN:  Are you having pain? No NPRS  scale: 0/10 Pain location: shoulder  Pain orientation: Rt PAIN TYPE: surgery Pain description:  Aggravating factors: nothing specific Relieving factors:       OBJECTIVE:    PATIENT SURVEYS:  03/31/2021 FOTO 41 (predicted 68) 05/11/21 FOTO 40 07/05/21 FOTO 60    COGNITION:          03/31/2021 Overall cognitive status: Within functional limits for tasks assessed                               SENSATION:          03/31/2021 Light touch: Appears intact          Stereognosis: Appears intact          Hot/Cold: Appears intact          Proprioception: Appears intact   POSTURE: 03/31/2021 Depressed right shoulder; forward shoulders bil   UPPER EXTREMITY AROM/PROM:   A/PROM Right 03/31/2021 Left 03/31/2021 Right 04/13/2021 All PROM in supine with pain limited end range/muscle guarding Right 04/21/2021 All PROM in supine Right 04/28/2021 All PROM in supine Right 04/28/2021 Right 05/11/2021 In supine Right 05/31/2021 Right 06/17/21  Shoulder flexion P:103 A:148  P: 165 105 130 147 120 AAROM in supine AROM 132 PROM 140 Standing AROM 0-90 degrees on Rt prior to shrug Supine 142 Standing 114 before shrug  Shoulder extension P:30 A:40  Shoulder abduction P:78 A:180 78 110 115  AROM 100 PROM 110  A supine 105 P supine 127  Shoulder adduction             Shoulder internal rotation P:49 A:60 P:80 34 (in 30 deg abduction)  52 (in 45 deg abduction)  AROM 60 PROM 65 in 45 deg abduction    Shoulder external rotation P:20 A:90 10 (in 30 deg abduction 50 ( 45 deg abduction) 52 ( 45 deg abduction)  AROM 55 PROM 58 in 45 deg abduction  A 55 P 58 In 45 deg abdct  (Blank rows = not tested)   UPPER EXTREMITY MMT:   MMT Right 03/31/2021 Left 03/31/2021  Shoulder flexion      Shoulder extension      Shoulder abduction      Shoulder adduction      Shoulder internal rotation      Shoulder external rotation      Middle trapezius      Lower trapezius      Elbow flexion      Elbow  extension      Wrist flexion      Wrist extension      Wrist ulnar deviation      Wrist radial deviation      Wrist pronation      Wrist supination      Grip strength (lbs)      (Blank rows = not tested)   PALPATION:  03/31/2021 Right UT, biceps, triceps, supra and infraspinatus muscle bellies            TODAY'S TREATMENT:  07/05/21 Therex:      Aerobic: UBE L3 x 6 min (3' each direction)     Supine: Rt shoulder flexion 3x10; 1# Rt shoulder circles CW/CCW x 20 reps; 2# in 90 deg flexion     Sidelying: Rt shoulder ER 2# 2x10 Rt shoulder scaption 2# 2x10     Standing: Rows L3 2x10 ER walkout with L2 band 2x10  Manual: PROM Rt shoulder to tolerance, grade 2-3 Inf and A   06/23/2021:  Manual: Supine Rt GH inferior mobs in flexion, scaption and abduction g3.  Mobilization c movement ER sustained posterior glide Rt shoulder   Therex: UBE fwd/back 3 mins each way lvl 3.0    Supine AROM flexion 1 lb 2 x 10    Supine 90 deg flexion shoulder circles 2 lb 20x cw, ccw Supine horizontal abduction green band 2 x10 Sidelying Rt shoulder abduction 1 lb 2 x 10 Sidelying Rt shoulder ER c towel at side 3 x 10 c fatigue hold on last rep 1 lbs Doorway ER stretch in approx. 70 deg abduction 30 sec x 3 Rt arm Standing green band rows, gh ext 2 x 15 bilateral    Standing shoulder flexion 0-90 degrees review x 3 (cues to avoid shrug, watch in mirror)  06/17/21 Therex:      Sitting: Pulleys flexion and scaption x 3 min each     Sidelying: Abduction with 5 sec hold at end range 2x10 ER 2x10 with 5 sec hold     Standing: Rows 2x10; L3 band  Manual: PROM Rt shoulder all motions to tolerance; G2-3 Inf and A/P mobs   PATIENT EDUCATION: 06/23/2021 Education details: HEP progression Person educated: Patient Education method: Consulting civil engineer, Media planner, Verbal cues, and Handouts Education comprehension: verbalized understanding and returned demonstration     HOME EXERCISE  PROGRAM: 06/23/2021:  Access Code: [email or web address] URL: [email or web address] Date: 06/23/2021  Prepared by: Scot Jun  Exercises - Standing Isometric Shoulder External Rotation with Doorway (Mirrored)  - 1-2 x daily - 7 x weekly - 1 sets - 5 reps - 10 sec  hold - Supine Shoulder Flexion Extension AAROM with Dowel  - 2-3 x daily - 7 x weekly - 1-2 sets - 10 reps - 5 hold - Standing Shoulder Abduction AAROM with Dowel (Mirrored)  - 2-3 x daily - 7 x weekly - 1-2 sets - 10 reps - Supine Shoulder Flexion Extension Full Range AROM  - 1-2 x daily - 7 x weekly - 1-2 sets - 10 reps - Sidelying Shoulder Abduction Palm Forward  - 1-2 x daily - 7 x weekly - 1-2 sets - 10 reps - Sidelying Shoulder External Rotation (Mirrored)  - 1-2 x daily - 7 x weekly - 1-2 sets - 10 reps - Single Arm Doorway Pec Stretch at 90 Degrees Abduction  - 2-3 x daily - 7 x weekly - 1 sets - 3-5 reps - 30 hold - Standing Shoulder Flexion to 90 Degrees with Dumbbells  - 1-2 x daily - 7 x weekly - 1-2 sets - 10 reps - Standing Shoulder Row with Anchored Resistance  - 1-2 x daily - 7 x weekly - 3 sets - 10-15 reps - Shoulder Extension with Resistance  - 1-2 x daily - 7 x weekly - 3 sets - 10-15 reps      ASSESSMENT:   CLINICAL IMPRESSION: Pt tolerated session well today, and continues to demonstrate strength deficits.  FOTO score improved today to 60%.  Will continue to benefit from PT to maximize function.  Will need recert next visit for 1x/wk x 4-6 weeks to continue focus on strengthening and maximizing motion.      ACTIVITY LIMITATIONS cleaning, driving, meal prep, occupation, and laundry.       REHAB POTENTIAL: Excellent   CLINICAL DECISION MAKING: Stable/uncomplicated   EVALUATION COMPLEXITY: Low     GOALS: Goals reviewed with patient? Yes   SHORT TERM GOALS:   STG Name Target Date Goal status  1 Independent with initial HEP  04/14/21 MET Assessed 04/13/2021    LONG TERM GOALS:    LTG Name  Target Date Goal status  1 Independent with final HEP  07/06/2021 On going Assessed 06/23/2021  2 FOTO score improved to 68 for improved function Baseline:41 07/06/2021 On going Assessed 06/23/2021  3 Rt shoulder flex and ABD improved to [facility] to complete [place] ADLs  07/06/2021 On going Assessed 06/23/2021  4 Report pain < 1/10 with ADLs   07/06/2021 On going Assessed 06/23/2021  5 Patient able to reach behind her back without difficulty using her right shoulder  07/06/2021 On going Assessed 06/23/2021  6 Patient to demonstrate improved ER to >= 65 deg to normalize ADLS  5/95/6387 On going Assessed 06/23/2021  7 Improved Rt shoulder strength to 4+/5 or better   07/06/2021 On going Assessed 06/23/2021        PLAN: PT FREQUENCY: 2x/week (1x/week per Pt due to visit number)   PT DURATION: 8 weeks   PLANNED INTERVENTIONS: Therapeutic exercises, Therapeutic activity, Neuro Muscular re-education, Balance training, Gait training, Patient/Family education, Joint mobilization, Dry Needling, Electrical stimulation, Cryotherapy, Moist heat, Taping, Vasopneumatic device, and Manual therapy   PLAN FOR NEXT SESSION:   needs recert next visit, Continue progressive strengthening and movement improvements to tolerance.       Laureen Abrahams, PT, DPT 07/05/21 10:37 AM

## 2021-07-12 ENCOUNTER — Ambulatory Visit (INDEPENDENT_AMBULATORY_CARE_PROVIDER_SITE_OTHER): Payer: 59 | Admitting: Rehabilitative and Restorative Service Providers"

## 2021-07-12 DIAGNOSIS — M25511 Pain in right shoulder: Secondary | ICD-10-CM | POA: Diagnosis not present

## 2021-07-12 DIAGNOSIS — M25611 Stiffness of right shoulder, not elsewhere classified: Secondary | ICD-10-CM

## 2021-07-12 DIAGNOSIS — M6281 Muscle weakness (generalized): Secondary | ICD-10-CM

## 2021-07-12 DIAGNOSIS — M62838 Other muscle spasm: Secondary | ICD-10-CM | POA: Diagnosis not present

## 2021-07-12 DIAGNOSIS — R600 Localized edema: Secondary | ICD-10-CM

## 2021-07-12 NOTE — Therapy (Addendum)
OUTPATIENT PHYSICAL THERAPY TREATMENT NOTE /RECERTIFICATION/PROGRESS NOTE DISCHARGE SUMMARY   Patient Name: Carly Lindsey MRN: [id number] DOB:Feb 03, 1976, 46 y.o., female Today's Date: 07/12/2021  Progress Note Reporting Period 05/11/2021 to 07/12/2021  See note below for Objective Data and Assessment of Progress/Goals.        PT End of Session - 07/12/21 0930     Visit Number 15    Number of Visits 20    Date for PT Re-Evaluation 08/23/21    Authorization Type [employer] 30% 20 visit limit calendar year    Authorization - Visit Number 15    Authorization - Number of Visits 20    PT Start Time [phone]    PT Stop Time 1010    PT Time Calculation (min) 39 min    Activity Tolerance Patient tolerated treatment well    Behavior During Therapy WFL for tasks assessed/performed              No past medical history on file. Past Surgical History:  Procedure Laterality Date   SHOULDER ARTHROSCOPY WITH OPEN ROTATOR CUFF REPAIR AND DISTAL CLAVICLE ACROMINECTOMY Right 03/17/2021   Procedure: RIGHT SHOULDER ARTHROSCOPY, DEBRIDEMENT, MINI OPEN ROTATOR CUFF TENDON REPAIR, BICEPS TENODESIS;  Surgeon: Meredith Pel, MD;  Location: [place];  Service: Orthopedics;  Laterality: Right;   WISDOM TOOTH EXTRACTION     Patient Active Problem List   Diagnosis Date Noted   Complete tear of right rotator cuff    Biceps tendonitis on right    Superior glenoid labrum lesion of right shoulder     REFERRING DIAG:  S46.011A (ICD-10-CM) - Traumatic complete tear of right rotator cuff, initial encounter   REFERRING PROVIDER: Meredith Pel, MD  ONSET DATE: 11/24/21 (surgery 03/17/2021)  THERAPY DIAG:  Stiffness of right shoulder, not elsewhere classified  Acute pain of right shoulder  Muscle weakness (generalized)  Other muscle spasm  Localized edema  PERTINENT HISTORY: unremarkable  PRECAUTIONS: Update 05/16/2021:  strengthening allowed but "no lifting out away from body" per MD note on  05/11/2021  SUBJECTIVE:  Pt indicated pain at most in last week 1/10.  Not taking medicine due to pain symptoms.  Pt indicated overall improvement to normal about 85% at this time.  Pt indicated difficulty c activity still present c lifting items at times.   PAIN:  Are you having pain? No NPRS scale: 0/10 Pain location: shoulder  Pain orientation: Rt PAIN TYPE: surgery Pain description:  Aggravating factors: nothing specific Relieving factors:       OBJECTIVE:    PATIENT SURVEYS:  03/31/2021 FOTO 41 (predicted 68) 05/11/21 FOTO 40 07/05/21 FOTO 60    COGNITION:          03/31/2021 Overall cognitive status: Within functional limits for tasks assessed                               SENSATION:          03/31/2021 Light touch: Appears intact          Stereognosis: Appears intact          Hot/Cold: Appears intact          Proprioception: Appears intact   POSTURE: 03/31/2021 Depressed right shoulder; forward shoulders bil   UPPER EXTREMITY AROM/PROM:   A/PROM Right 03/31/2021 Left 03/31/2021 Right 04/13/2021 All PROM in supine with pain limited end range/muscle guarding Right 04/21/2021 All PROM in supine Right 04/28/2021 All PROM in supine  Right 04/28/2021 Right 05/11/2021 In supine Right 05/31/2021 Right 06/17/21 Right 07/12/2021  Shoulder flexion P:103 A:148  P: 165 105 130 147 120 AAROM in supine AROM 132 PROM 140 Standing AROM 0-90 degrees on Rt prior to shrug Supine 142 Standing 114 before shrug Supine 145  Shoulder extension P:30 A:40          Shoulder abduction P:78 A:180 78 110 115  AROM 100 PROM 110  A supine 105 P supine 127 Supine 133  Shoulder adduction              Shoulder internal rotation P:49 A:60 P:80 34 (in 30 deg abduction)  52 (in 45 deg abduction)  AROM 60 PROM 65 in 45 deg abduction   AROM 60  in 45 deg abduction  Shoulder external rotation P:20 A:90 10 (in 30 deg abduction 50 ( 45 deg abduction) 52 ( 45 deg abduction)  AROM 55 PROM 58 in 45 deg  abduction  A 55 P 58 In 45 deg abdct AROM 65 in 45 deg abduction  (Blank rows = not tested)   UPPER EXTREMITY MMT:   MMT Right 03/31/2021 Left 03/31/2021 Right 07/12/2021 Left 07/12/2021  Shoulder flexion  Not tested due to surgery   4/5 5/5  Shoulder extension        Shoulder abduction     4/5 5/5  Shoulder adduction        Shoulder internal rotation     5/5 5/5  Shoulder external rotation     4/5 5/5  Middle trapezius        Lower trapezius        Elbow flexion        Elbow extension        Wrist flexion        Wrist extension        Wrist ulnar deviation        Wrist radial deviation        Wrist pronation        Wrist supination        Grip strength (lbs)        (Blank rows = not tested)   PALPATION:  03/31/2021 Right UT, biceps, triceps, supra and infraspinatus muscle bellies            TODAY'S TREATMENT:  07/12/2021:  Manual: Mobilization c movement ER sustained posterior glide Rt shoulder   Therex: Pulleys 3 mins flexion, abduction each    Standing wall slide flexion 5 sec hold x 10 (added to HEP) Doorway ER stretch in approx. 70 deg abduction 30 sec x 3 Rt arm Wall push up c SA press hold 2 seconds x 15 Standing Rt shoulder ER c towel at side green band 2 x 10  Standing ball circles in 90 deg flexion at wall Rt arm 2 x 20 cw, ccw each way Standing green band rows, gh ext 2 x 15 bilateral    Standing shoulder flexion to 90 then move to abduction and then down then reverse x 10 bilateral  07/05/21 Therex:      Aerobic: UBE L3 x 6 min (3' each direction)     Supine: Rt shoulder flexion 3x10; 1# Rt shoulder circles CW/CCW x 20 reps; 2# in 90 deg flexion     Sidelying: Rt shoulder ER 2# 2x10 Rt shoulder scaption 2# 2x10     Standing: Rows L3 2x10 ER walkout with L2 band 2x10  Manual: PROM Rt shoulder to tolerance, grade 2-3 Inf and  A   06/23/2021:  Manual: Supine Rt GH inferior mobs in flexion, scaption and abduction g3.  Mobilization c movement ER  sustained posterior glide Rt shoulder   Therex: UBE fwd/back 3 mins each way lvl 3.0    Supine AROM flexion 1 lb 2 x 10    Supine 90 deg flexion shoulder circles 2 lb 20x cw, ccw Supine horizontal abduction green band 2 x10 Sidelying Rt shoulder abduction 1 lb 2 x 10 Sidelying Rt shoulder ER c towel at side 3 x 10 c fatigue hold on last rep 1 lbs Doorway ER stretch in approx. 70 deg abduction 30 sec x 3 Rt arm Standing green band rows, gh ext 2 x 15 bilateral    Standing shoulder flexion 0-90 degrees review x 3 (cues to avoid shrug, watch in mirror)   PATIENT EDUCATION: 07/12/2021 Education details: HEP progression Person educated: Patient Education method: Consulting civil engineer, Media planner, Verbal cues, and Handouts Education comprehension: verbalized understanding and returned demonstration     HOME EXERCISE PROGRAM: Access Code: [email or web address] URL: [email or web address] Date: 07/12/2021 Prepared by: Scot Jun  Exercises - Sidelying Shoulder Abduction Palm Forward  - 1-2 x daily - 7 x weekly - 1-2 sets - 10 reps - Single Arm Doorway Pec Stretch at 90 Degrees Abduction  - 2-3 x daily - 7 x weekly - 1 sets - 3-5 reps - 30 hold - Standing Shoulder Flexion to 90 Degrees with Dumbbells  - 1-2 x daily - 7 x weekly - 1-2 sets - 10 reps - Standing Shoulder Row with Anchored Resistance  - 1-2 x daily - 7 x weekly - 3 sets - 10-15 reps - Shoulder Extension with Resistance  - 1-2 x daily - 7 x weekly - 3 sets - 10-15 reps - Shoulder External Rotation with Anchored Resistance (Mirrored)  - 1-2 x daily - 7 x weekly - 3 sets - 10 reps - Standing shoulder flexion wall slides  - 1-2 x daily - 7 x weekly - 1 sets - 10 reps - 5 hold - Wall Push Up  - 1 x daily - 7 x weekly - 1-2 sets - 10-15 reps      ASSESSMENT:   CLINICAL IMPRESSION: Pt has attended 14 visits overall during course of treatment, reporting minimal pain complaints overall and improvement of 85% to normal.  See objective  data for updated information regarding current presentation.  Improvements noted in active mobility and strength (though lacking compared to Lt at this time).  Pt to continue to benefit from progressive skilled PT services to address remaining impairments c goals of reaching PLOF/goals.        ACTIVITY LIMITATIONS cleaning, driving, meal prep, occupation, and laundry.       REHAB POTENTIAL: Excellent   CLINICAL DECISION MAKING: Stable/uncomplicated   EVALUATION COMPLEXITY: Low     GOALS: Goals reviewed with patient? Yes   SHORT TERM GOALS:   STG Name Target Date Goal status  1 Independent with initial HEP  04/14/21 MET Assessed 04/13/2021    LONG TERM GOALS:    LTG Name Target Date Goal status  1 Independent with final HEP  08/23/2021 Revised Assessed 07/12/2021  2 FOTO score improved to 68 for improved function  08/23/2021 Revised Assessed 06/23/2021  3 Rt shoulder flex and ABD improved to [facility] to complete [place] ADLs  08/23/2021 Revised Assessed 06/23/2021  4 Report pain < 1/10 with ADLs   08/23/2021 Revised Assessed 06/23/2021  5 Patient able to reach behind her back  without difficulty using her right shoulder  08/23/2021 Revised Assessed 06/23/2021  6 Patient to demonstrate improved ER to >= 65 deg to normalize ADLS  8/59/2924 On Revised Assessed 06/23/2021  7 Improved Rt shoulder strength to 4+/5 or better   08/23/2021 Revised Assessed 06/23/2021        PLAN: PT FREQUENCY: 1x/week   PT DURATION: 6 weeks    PLANNED INTERVENTIONS: Therapeutic exercises, Therapeutic activity, Neuro Muscular re-education, Balance training, Gait training, Patient/Family education, Joint mobilization, Dry Needling, Electrical stimulation, Cryotherapy, Moist heat, Taping, Vasopneumatic device, and Manual therapy   PLAN FOR NEXT SESSION:  Continue progressive gravity dependent strengthening   Scot Jun, PT, DPT, OCS, ATC 07/12/21  10:07 AM      PHYSICAL THERAPY DISCHARGE  SUMMARY  Visits from Start of Care: 15  Current functional level related to goals / functional outcomes: See above   Remaining deficits: See above   Education / Equipment: HEP   Patient agrees to discharge. Patient goals were partially met. Patient is being discharged due to not returning since the last visit.  Laureen Abrahams, PT, DPT 09/13/21 2:48 PM  [facility] [address] [place], [place], [address] Phone: [phone]   Fax:  [phone]

## 2021-07-21 ENCOUNTER — Encounter: Payer: 59 | Admitting: Physical Therapy

## 2021-08-17 ENCOUNTER — Ambulatory Visit (INDEPENDENT_AMBULATORY_CARE_PROVIDER_SITE_OTHER): Payer: 59 | Admitting: Orthopedic Surgery

## 2021-08-17 DIAGNOSIS — S46011A Strain of muscle(s) and tendon(s) of the rotator cuff of right shoulder, initial encounter: Secondary | ICD-10-CM | POA: Diagnosis not present

## 2021-08-21 ENCOUNTER — Encounter: Payer: Self-pay | Admitting: Orthopedic Surgery

## 2021-08-21 NOTE — Progress Notes (Signed)
   Post-Op Visit Note   Patient: Carly Lindsey           Date of Birth: Nov 28, 1975           MRN: [id number] Visit Date: 08/17/2021 PCP: Patient, No Pcp Per   Assessment & Plan:  Chief Complaint:  Chief Complaint  Patient presents with   Right Shoulder - Routine Post Op     03/17/21 Right Shoulder Arthroscopy, Debridement, Mini Open Rotator Cuff Tendon Repair, Biceps Tenodesis    Visit Diagnoses:  1. Traumatic complete tear of right rotator cuff, initial encounter     Plan: Patient is a 46 year old female who presents s/p right shoulder rotator cuff repair and biceps tenodesis on 03/17/2021.  She states that she is doing very well.  She has occasional twinges of pain but nothing consistent.  Never has to take any medication.  She can sleep on her right shoulder without difficulty.  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  On exam, she has 60 degrees X rotation, 80 degrees abduction, 150 degrees forward flexion.  5/5 motor strength of supraspinatus and subscapularis.  5 -/5 infraspinatus strength.  Incisions are well-healed.  Axillary nerve intact with deltoid firing.  No coarse grinding or crepitus noted with passive motion of the shoulder.  Plan is for patient to continue with home exercise program and avoid heavy lifting overhead or out and away from her body.  Follow-up with the office as needed.  Follow-Up Instructions: No follow-ups on file.   Orders:  No orders of the defined types were placed in this encounter.  No orders of the defined types were placed in this encounter.   Imaging: No results found.  PMFS History: Patient Active Problem List   Diagnosis Date Noted   Complete tear of right rotator cuff    Biceps tendonitis on right    Superior glenoid labrum lesion of right shoulder    No past medical history on file.  No family history on file.  Past Surgical History:  Procedure Laterality Date   SHOULDER ARTHROSCOPY WITH OPEN ROTATOR  CUFF REPAIR AND DISTAL CLAVICLE ACROMINECTOMY Right 03/17/2021   Procedure: RIGHT SHOULDER ARTHROSCOPY, DEBRIDEMENT, MINI OPEN ROTATOR CUFF TENDON REPAIR, BICEPS TENODESIS;  Surgeon: Cammy Copa, MD;  Location: [facility];  Service: Orthopedics;  Laterality: Right;   WISDOM TOOTH EXTRACTION     Social History   Occupational History   Not on file  Tobacco Use   Smoking status: Never   Smokeless tobacco: Not on file  Substance and Sexual Activity   Alcohol use: Yes    Comment: Occasional   Drug use: No   Sexual activity: Yes    Birth control/protection: None

## 2022-12-09 ENCOUNTER — Ambulatory Visit
Admission: RE | Admit: 2022-12-09 | Discharge: 2022-12-09 | Disposition: A | Discharge status: Home / Self Care | Payer: 59 | Source: Ambulatory Visit | Attending: Obstetrics and Gynecology | Admitting: Obstetrics and Gynecology

## 2022-12-09 ENCOUNTER — Other Ambulatory Visit: Payer: Self-pay | Admitting: Obstetrics and Gynecology

## 2022-12-09 DIAGNOSIS — Z1231 Encounter for screening mammogram for malignant neoplasm of breast: Secondary | ICD-10-CM
